# Patient Record
Sex: Female | Born: 1955
Health system: Southern US, Community
[De-identification: ages and names within clinical notes are randomized; demographics above are authoritative.]

## PROBLEM LIST (undated history)

## (undated) DIAGNOSIS — Z9889 Other specified postprocedural states: Secondary | ICD-10-CM

## (undated) DIAGNOSIS — J45909 Unspecified asthma, uncomplicated: Secondary | ICD-10-CM

## (undated) DIAGNOSIS — R51 Headache: Secondary | ICD-10-CM

## (undated) DIAGNOSIS — M797 Fibromyalgia: Secondary | ICD-10-CM

## (undated) DIAGNOSIS — Z9013 Acquired absence of bilateral breasts and nipples: Secondary | ICD-10-CM

## (undated) DIAGNOSIS — G4730 Sleep apnea, unspecified: Secondary | ICD-10-CM

## (undated) DIAGNOSIS — R112 Nausea with vomiting, unspecified: Secondary | ICD-10-CM

## (undated) DIAGNOSIS — R519 Headache, unspecified: Secondary | ICD-10-CM

## (undated) DIAGNOSIS — M1990 Unspecified osteoarthritis, unspecified site: Secondary | ICD-10-CM

## (undated) DIAGNOSIS — K219 Gastro-esophageal reflux disease without esophagitis: Secondary | ICD-10-CM

## (undated) DIAGNOSIS — K8010 Calculus of gallbladder with chronic cholecystitis without obstruction: Secondary | ICD-10-CM

## (undated) DIAGNOSIS — Z90710 Acquired absence of both cervix and uterus: Secondary | ICD-10-CM

## (undated) DIAGNOSIS — C801 Malignant (primary) neoplasm, unspecified: Secondary | ICD-10-CM

## (undated) HISTORY — DX: Gastro-esophageal reflux disease without esophagitis: K21.9

## (undated) HISTORY — PX: EYE SURGERY: SHX253

## (undated) HISTORY — PX: BREAST SURGERY: SHX581

## (undated) HISTORY — DX: Unspecified osteoarthritis, unspecified site: M19.90

## (undated) HISTORY — PX: ABDOMINAL HYSTERECTOMY: SHX81

## (undated) HISTORY — PX: BARIATRIC SURGERY: SHX1103

---

## 2013-01-02 ENCOUNTER — Emergency Department (HOSPITAL_COMMUNITY): Payer: No Typology Code available for payment source

## 2013-01-02 ENCOUNTER — Encounter (HOSPITAL_COMMUNITY): Payer: Self-pay | Admitting: Emergency Medicine

## 2013-01-02 ENCOUNTER — Emergency Department (HOSPITAL_COMMUNITY)
Admission: EM | Admit: 2013-01-02 | Discharge: 2013-01-02 | Disposition: A | Discharge status: Home / Self Care | Payer: No Typology Code available for payment source | Attending: Emergency Medicine | Admitting: Emergency Medicine

## 2013-01-02 DIAGNOSIS — Y92410 Unspecified street and highway as the place of occurrence of the external cause: Secondary | ICD-10-CM | POA: Insufficient documentation

## 2013-01-02 DIAGNOSIS — S0990XA Unspecified injury of head, initial encounter: Secondary | ICD-10-CM | POA: Insufficient documentation

## 2013-01-02 DIAGNOSIS — F411 Generalized anxiety disorder: Secondary | ICD-10-CM | POA: Insufficient documentation

## 2013-01-02 DIAGNOSIS — S0993XA Unspecified injury of face, initial encounter: Secondary | ICD-10-CM | POA: Insufficient documentation

## 2013-01-02 DIAGNOSIS — IMO0002 Reserved for concepts with insufficient information to code with codable children: Secondary | ICD-10-CM | POA: Insufficient documentation

## 2013-01-02 DIAGNOSIS — Z8589 Personal history of malignant neoplasm of other organs and systems: Secondary | ICD-10-CM | POA: Insufficient documentation

## 2013-01-02 DIAGNOSIS — Y9389 Activity, other specified: Secondary | ICD-10-CM | POA: Insufficient documentation

## 2013-01-02 HISTORY — DX: Other specified postprocedural states: Z98.890

## 2013-01-02 HISTORY — DX: Acquired absence of both cervix and uterus: Z90.710

## 2013-01-02 HISTORY — DX: Acquired absence of bilateral breasts and nipples: Z90.13

## 2013-01-02 HISTORY — DX: Malignant (primary) neoplasm, unspecified: C80.1

## 2013-01-02 LAB — BASIC METABOLIC PANEL
BUN: 16 mg/dL (ref 6–23)
Calcium: 9.4 mg/dL (ref 8.4–10.5)
Creatinine, Ser: 0.92 mg/dL (ref 0.50–1.10)
GFR calc Af Amer: 79 mL/min — ABNORMAL LOW (ref >90)
GFR calc non Af Amer: 68 mL/min — ABNORMAL LOW (ref >90)
Glucose, Bld: 102 mg/dL — ABNORMAL HIGH (ref 70–99)
Sodium: 142 mEq/L (ref 135–145)

## 2013-01-02 LAB — CBC
HCT: 40.4 % (ref 36.0–46.0)
Hemoglobin: 13.9 g/dL (ref 12.0–15.0)
MCH: 30.2 pg (ref 26.0–34.0)
MCHC: 34.4 g/dL (ref 30.0–36.0)
RDW: 14 % (ref 11.5–15.5)

## 2013-01-02 MED ORDER — LORAZEPAM 2 MG/ML IJ SOLN
1.0000 mg | Freq: Once | INTRAMUSCULAR | Status: AC
  Administered 2013-01-02: 1 mg via INTRAMUSCULAR
  Filled 2013-01-02: qty 1

## 2013-01-02 MED ORDER — KETOROLAC TROMETHAMINE 30 MG/ML IJ SOLN
30.0000 mg | Freq: Once | INTRAMUSCULAR | Status: AC
  Administered 2013-01-02: 30 mg via INTRAMUSCULAR
  Filled 2013-01-02: qty 1

## 2013-01-02 MED ORDER — TRAMADOL HCL 50 MG PO TABS: 50.0000 mg | ORAL_TABLET | Freq: Four times a day (QID) | ORAL | Status: AC | PRN
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
## 2013-01-02 NOTE — ED Notes (Signed)
Pt in radiology at this time. 
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
## 2014-01-17 ENCOUNTER — Encounter (HOSPITAL_COMMUNITY): Payer: Self-pay | Admitting: Emergency Medicine

## 2014-06-17 ENCOUNTER — Ambulatory Visit (INDEPENDENT_AMBULATORY_CARE_PROVIDER_SITE_OTHER): Payer: BLUE CROSS/BLUE SHIELD | Admitting: Family Medicine

## 2014-06-17 VITALS — BP 102/70 | HR 65 | Temp 98.0°F | Resp 20 | Ht 67.0 in | Wt 198.0 lb

## 2014-06-17 DIAGNOSIS — S8011XA Contusion of right lower leg, initial encounter: Secondary | ICD-10-CM
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
## 2014-06-17 NOTE — Patient Instructions (Signed)
I do not think that your bruise will put you at risk for any other complications at this time Let us know if you have any other concerns.   Dr. Maceo Pro at [facility] I believe has some interest in mold exposure

## 2014-08-08 ENCOUNTER — Other Ambulatory Visit: Payer: Self-pay | Admitting: *Deleted

## 2014-08-08 DIAGNOSIS — R202 Paresthesia of skin: Principal | ICD-10-CM

## 2014-08-08 DIAGNOSIS — R200 Anesthesia of skin: Secondary | ICD-10-CM

## 2014-08-16 ENCOUNTER — Encounter: Payer: Self-pay | Admitting: Neurology

## 2014-09-13 ENCOUNTER — Ambulatory Visit (INDEPENDENT_AMBULATORY_CARE_PROVIDER_SITE_OTHER): Payer: BLUE CROSS/BLUE SHIELD | Admitting: Neurology

## 2014-09-13 DIAGNOSIS — R202 Paresthesia of skin: Secondary | ICD-10-CM | POA: Diagnosis not present

## 2014-09-13 DIAGNOSIS — R200 Anesthesia of skin: Secondary | ICD-10-CM

## 2014-09-13 DIAGNOSIS — G5601 Carpal tunnel syndrome, right upper limb: Secondary | ICD-10-CM

## 2014-09-13 NOTE — Procedures (Signed)
[facility]  [place], [place]  [place], [place] [address] Tel: [phone] Fax:  [phone] Test Date:  09/13/2014  Patient: Anna Carroll DOB: [birth year] Physician: Narda Amber  Sex: Female Height: 5\' 7"  Ref Phys: Dr. Moreen Fowler  ID#: [id number] Temp: 33.0C Technician: Jerilynn Mages. Dean   Patient Complaints: This is a 59 year-old female presenting for evaluation of right hand weakness and paresthesias.   NCV & EMG Findings: Extensive electrodiagnostic testing of the right upper extremity shows:  1. Right median sensory response is absent. Right ulnar and radial sensory responses are within normal limits. 2. Right median motor nerve showed prolonged distal onset latency (7.9 ms) and reduced amplitude (4.7 mV). Right ulnar motor responses within normal limits. 3. Chronic motor axon loss changes isolated to the right abductor pollicis brevis muscle, without accompanied active denervation.  Impression: 1. Right median neuropathy, at or distal to the wrist, consistent with the clinical diagnosis of carpal tunnel syndrome. Overall, these findings are severe in degree electrically. 2. There is no evidence of a cervical radiculopathy affecting the right side.   ___________________________ Narda Amber, DO    Nerve Conduction Studies Anti Sensory Summary Table   Stim Site NR Peak (ms) Norm Peak (ms) P-T Amp (V) Norm P-T Amp  Right Median Anti Sensory (2nd Digit)  Wrist NR  <3.6  >15  Right Radial Anti Sensory (Base 1st Digit)  Wrist    1.7 <2.7 42.1 >14  Right Ulnar Anti Sensory (5th Digit)  Wrist    3.0 <3.1 46.4 >10   Motor Summary Table   Stim Site NR Onset (ms) Norm Onset (ms) O-P Amp (mV) Norm O-P Amp Site1 Site2 Delta-0 (ms) Dist (cm) Vel (m/s) Norm Vel (m/s)  Right Median Motor (Abd Poll Brev)  Wrist    7.9 <4.0 4.7 >6 Elbow Wrist 4.6 28.0 61 >50  Elbow    12.5  4.5         Right Ulnar Motor (Abd Dig Minimi)  Wrist    2.8 <3.1 12.3 >7 B Elbow Wrist 3.9  21.0 54 >50  B Elbow    6.7  12.7  A Elbow B Elbow 1.6 10.0 62 >50  A Elbow    8.3  12.7          EMG   Side Muscle Ins Act Fibs Psw Fasc Number Recrt Dur Dur. Amp Amp. Poly Poly. Comment  Right 1stDorInt Nml Nml Nml Nml Nml Nml Nml Nml Nml Nml Nml Nml N/A  Right Abd Poll Brev Nml Nml Nml Nml 1- Rapid Some 1+ Some 1+ Nml Nml N/A  Right Ext Indicis Nml Nml Nml Nml Nml Nml Nml Nml Nml Nml Nml Nml N/A  Right PronatorTeres Nml Nml Nml Nml Nml Nml Nml Nml Nml Nml Nml Nml N/A  Right Biceps Nml Nml Nml Nml Nml Nml Nml Nml Nml Nml Nml Nml N/A  Right Triceps Nml Nml Nml Nml Nml Nml Nml Nml Nml Nml Nml Nml N/A  Right Deltoid Nml Nml Nml Nml Nml Nml Nml Nml Nml Nml Nml Nml N/A      Waveforms:

## 2014-11-14 ENCOUNTER — Telehealth: Payer: Self-pay | Admitting: *Deleted

## 2014-11-14 NOTE — Telephone Encounter (Signed)
EMG SENT.

## 2014-11-14 NOTE — Telephone Encounter (Signed)
Patient requesting a copy of her EMG be sent to Dr. Otho Najjar @ [facility].

## 2014-12-28 ENCOUNTER — Other Ambulatory Visit: Payer: Self-pay | Admitting: Family Medicine

## 2014-12-28 DIAGNOSIS — R1010 Upper abdominal pain, unspecified: Secondary | ICD-10-CM

## 2014-12-30 ENCOUNTER — Ambulatory Visit
Admission: RE | Admit: 2014-12-30 | Discharge: 2014-12-30 | Disposition: A | Discharge status: Home / Self Care | Payer: BLUE CROSS/BLUE SHIELD | Source: Ambulatory Visit | Attending: Family Medicine | Admitting: Family Medicine

## 2014-12-30 DIAGNOSIS — R1010 Upper abdominal pain, unspecified: Secondary | ICD-10-CM

## 2015-01-04 ENCOUNTER — Ambulatory Visit: Payer: Self-pay | Admitting: General Surgery

## 2015-01-09 ENCOUNTER — Encounter (HOSPITAL_COMMUNITY): Payer: Self-pay | Admitting: *Deleted

## 2015-01-09 MED ORDER — CHLORHEXIDINE GLUCONATE 4 % EX LIQD: 1.0000 "application " | Freq: Once | CUTANEOUS | Status: DC

## 2015-01-09 MED ORDER — CEFAZOLIN SODIUM-DEXTROSE 2-3 GM-% IV SOLR
2.0000 g | INTRAVENOUS | Status: AC
  Administered 2015-01-10: 2 g via INTRAVENOUS
  Filled 2015-01-09: qty 50

## 2015-01-09 NOTE — Progress Notes (Signed)
Pt denies SOB, chest pain, and being under the care of a cardiologist. Pt denies having a cardiac cath but stated that several cardiac studies, an EKG and chest x ray were performed at [facility]; records requested. Pt made aware to stop  taking Aspirin,otc vitamins and herbal medications. Do not take any NSAIDs ie: Ibuprofen, Advil, Naproxen or any medication containing Aspirin. Pt verbalized understanding of all pre-op instructions.

## 2015-01-10 ENCOUNTER — Ambulatory Visit (HOSPITAL_COMMUNITY): Payer: BLUE CROSS/BLUE SHIELD

## 2015-01-10 ENCOUNTER — Ambulatory Visit (HOSPITAL_COMMUNITY): Payer: BLUE CROSS/BLUE SHIELD | Admitting: Certified Registered Nurse Anesthetist

## 2015-01-10 ENCOUNTER — Observation Stay (HOSPITAL_COMMUNITY)
Admission: RE | Admit: 2015-01-10 | Discharge: 2015-01-11 | Disposition: A | Discharge status: Home / Self Care | Payer: BLUE CROSS/BLUE SHIELD | Source: Ambulatory Visit | Attending: General Surgery | Admitting: General Surgery

## 2015-01-10 ENCOUNTER — Encounter (HOSPITAL_COMMUNITY): Payer: Self-pay | Admitting: *Deleted

## 2015-01-10 ENCOUNTER — Encounter (HOSPITAL_COMMUNITY)
Admission: RE | Disposition: A | Discharge status: Home / Self Care | Payer: Self-pay | Source: Ambulatory Visit | Attending: General Surgery

## 2015-01-10 DIAGNOSIS — Z9049 Acquired absence of other specified parts of digestive tract: Secondary | ICD-10-CM

## 2015-01-10 DIAGNOSIS — Z853 Personal history of malignant neoplasm of breast: Secondary | ICD-10-CM | POA: Diagnosis not present

## 2015-01-10 DIAGNOSIS — Z79899 Other long term (current) drug therapy: Secondary | ICD-10-CM | POA: Diagnosis not present

## 2015-01-10 DIAGNOSIS — K219 Gastro-esophageal reflux disease without esophagitis: Secondary | ICD-10-CM | POA: Insufficient documentation

## 2015-01-10 DIAGNOSIS — J45909 Unspecified asthma, uncomplicated: Secondary | ICD-10-CM | POA: Insufficient documentation

## 2015-01-10 DIAGNOSIS — K8010 Calculus of gallbladder with chronic cholecystitis without obstruction: Principal | ICD-10-CM | POA: Insufficient documentation

## 2015-01-10 DIAGNOSIS — Z419 Encounter for procedure for purposes other than remedying health state, unspecified: Secondary | ICD-10-CM

## 2015-01-10 HISTORY — PX: CHOLECYSTECTOMY, LAPAROSCOPIC: SHX56

## 2015-01-10 HISTORY — PX: CHOLECYSTECTOMY: SHX55

## 2015-01-10 HISTORY — DX: Headache: R51

## 2015-01-10 HISTORY — DX: Nausea with vomiting, unspecified: R11.2

## 2015-01-10 HISTORY — DX: Sleep apnea, unspecified: G47.30

## 2015-01-10 HISTORY — DX: Calculus of gallbladder with chronic cholecystitis without obstruction: K80.10

## 2015-01-10 HISTORY — DX: Headache, unspecified: R51.9

## 2015-01-10 HISTORY — DX: Unspecified asthma, uncomplicated: J45.909

## 2015-01-10 HISTORY — DX: Other specified postprocedural states: Z98.890

## 2015-01-10 HISTORY — DX: Fibromyalgia: M79.7

## 2015-01-10 LAB — COMPREHENSIVE METABOLIC PANEL
ALT: 18 U/L (ref 14–54)
ANION GAP: 7 (ref 5–15)
AST: 15 U/L (ref 15–41)
Albumin: 3.7 g/dL (ref 3.5–5.0)
Alkaline Phosphatase: 72 U/L (ref 38–126)
BUN: 10 mg/dL (ref 6–20)
CALCIUM: 9.4 mg/dL (ref 8.9–10.3)
CHLORIDE: 109 mmol/L (ref 101–111)
CO2: 27 mmol/L (ref 22–32)
Creatinine, Ser: 0.97 mg/dL (ref 0.44–1.00)
GFR calc non Af Amer: >60 mL/min (ref >60)
Glucose, Bld: 96 mg/dL (ref 65–99)
Potassium: 4.2 mmol/L (ref 3.5–5.1)
SODIUM: 143 mmol/L (ref 135–145)
Total Bilirubin: 0.7 mg/dL (ref 0.3–1.2)
Total Protein: 6.1 g/dL — ABNORMAL LOW (ref 6.5–8.1)

## 2015-01-10 LAB — CBC WITH DIFFERENTIAL/PLATELET
Basophils Absolute: 0 10*3/uL (ref 0.0–0.1)
Basophils Relative: 0 %
EOS ABS: 0.1 10*3/uL (ref 0.0–0.7)
EOS PCT: 1 %
HCT: 38 % (ref 36.0–46.0)
Hemoglobin: 12.4 g/dL (ref 12.0–15.0)
LYMPHS PCT: 44 %
Lymphs Abs: 2.3 10*3/uL (ref 0.7–4.0)
MCH: 29 pg (ref 26.0–34.0)
MCHC: 32.6 g/dL (ref 30.0–36.0)
MCV: 89 fL (ref 78.0–100.0)
MONO ABS: 0.5 10*3/uL (ref 0.1–1.0)
MONOS PCT: 9 %
Neutro Abs: 2.4 10*3/uL (ref 1.7–7.7)
Neutrophils Relative %: 46 %
PLATELETS: 176 10*3/uL (ref 150–400)
RBC: 4.27 MIL/uL (ref 3.87–5.11)
RDW: 13.7 % (ref 11.5–15.5)
WBC: 5.3 10*3/uL (ref 4.0–10.5)

## 2015-01-10 SURGERY — LAPAROSCOPIC CHOLECYSTECTOMY WITH INTRAOPERATIVE CHOLANGIOGRAM
Anesthesia: General | Site: Abdomen

## 2015-01-10 MED ORDER — ONDANSETRON HCL 4 MG/2ML IJ SOLN: 4.0000 mg | Freq: Four times a day (QID) | INTRAMUSCULAR | Status: DC | PRN

## 2015-01-10 MED ORDER — LIDOCAINE HCL (CARDIAC) 20 MG/ML IV SOLN
INTRAVENOUS | Status: AC
  Filled 2015-01-10: qty 5

## 2015-01-10 MED ORDER — ONDANSETRON 4 MG PO TBDP
4.0000 mg | ORAL_TABLET | Freq: Four times a day (QID) | ORAL | Status: DC | PRN
  Filled 2015-01-10: qty 1

## 2015-01-10 MED ORDER — GLYCOPYRROLATE 0.2 MG/ML IJ SOLN
INTRAMUSCULAR | Status: AC
  Filled 2015-01-10: qty 3

## 2015-01-10 MED ORDER — MIDAZOLAM HCL 2 MG/2ML IJ SOLN
INTRAMUSCULAR | Status: AC
  Filled 2015-01-10: qty 4

## 2015-01-10 MED ORDER — PROPOFOL 10 MG/ML IV BOLUS
INTRAVENOUS | Status: AC
  Filled 2015-01-10: qty 20

## 2015-01-10 MED ORDER — FENTANYL CITRATE (PF) 250 MCG/5ML IJ SOLN
INTRAMUSCULAR | Status: AC
  Filled 2015-01-10: qty 5

## 2015-01-10 MED ORDER — LACTATED RINGERS IV SOLN
INTRAVENOUS | Status: DC
Start: 2015-01-10 — End: 2015-01-11
  Administered 2015-01-10 (×2): via INTRAVENOUS

## 2015-01-10 MED ORDER — 0.9 % SODIUM CHLORIDE (POUR BTL) OPTIME
TOPICAL | Status: DC | PRN
  Administered 2015-01-10: 1000 mL

## 2015-01-10 MED ORDER — ONDANSETRON HCL 4 MG/2ML IJ SOLN
INTRAMUSCULAR | Status: AC
  Filled 2015-01-10: qty 2

## 2015-01-10 MED ORDER — KCL IN DEXTROSE-NACL 20-5-0.45 MEQ/L-%-% IV SOLN
INTRAVENOUS | Status: DC
  Administered 2015-01-10: 17:00:00 via INTRAVENOUS
  Filled 2015-01-10: qty 1000

## 2015-01-10 MED ORDER — LIDOCAINE HCL (CARDIAC) 20 MG/ML IV SOLN
INTRAVENOUS | Status: DC | PRN
  Administered 2015-01-10: 50 mg via INTRAVENOUS

## 2015-01-10 MED ORDER — ROCURONIUM BROMIDE 50 MG/5ML IV SOLN
INTRAVENOUS | Status: AC
  Filled 2015-01-10: qty 1

## 2015-01-10 MED ORDER — PROMETHAZINE HCL 25 MG/ML IJ SOLN
6.2500 mg | INTRAMUSCULAR | Status: DC | PRN
Start: 2015-01-10 — End: 2015-01-10
  Administered 2015-01-10: 6.25 mg via INTRAVENOUS

## 2015-01-10 MED ORDER — ENOXAPARIN SODIUM 40 MG/0.4ML ~~LOC~~ SOLN
40.0000 mg | SUBCUTANEOUS | Status: DC
  Administered 2015-01-11: 40 mg via SUBCUTANEOUS
  Filled 2015-01-10: qty 0.4

## 2015-01-10 MED ORDER — OXYCODONE HCL 5 MG PO TABS: 5.0000 mg | ORAL_TABLET | ORAL | Status: AC | PRN

## 2015-01-10 MED ORDER — PROPOFOL 10 MG/ML IV BOLUS
INTRAVENOUS | Status: DC | PRN
Start: 2015-01-10 — End: 2015-01-10
  Administered 2015-01-10: 140 mg via INTRAVENOUS

## 2015-01-10 MED ORDER — OXYCODONE HCL 5 MG PO TABS
5.0000 mg | ORAL_TABLET | ORAL | Status: DC | PRN
  Administered 2015-01-10 – 2015-01-11 (×4): 10 mg via ORAL
  Filled 2015-01-10 (×3): qty 2

## 2015-01-10 MED ORDER — ONDANSETRON HCL 4 MG/2ML IJ SOLN
INTRAMUSCULAR | Status: DC | PRN
  Administered 2015-01-10: 4 mg via INTRAVENOUS

## 2015-01-10 MED ORDER — TRAMADOL HCL 50 MG PO TABS
50.0000 mg | ORAL_TABLET | Freq: Four times a day (QID) | ORAL | Status: DC | PRN
  Filled 2015-01-10: qty 1

## 2015-01-10 MED ORDER — PHENYLEPHRINE HCL 10 MG/ML IJ SOLN
INTRAMUSCULAR | Status: AC
  Filled 2015-01-10: qty 1

## 2015-01-10 MED ORDER — MORPHINE SULFATE (PF) 2 MG/ML IV SOLN: 2.0000 mg | INTRAVENOUS | Status: DC | PRN

## 2015-01-10 MED ORDER — NEOSTIGMINE METHYLSULFATE 10 MG/10ML IV SOLN
INTRAVENOUS | Status: AC
  Filled 2015-01-10: qty 1

## 2015-01-10 MED ORDER — OXYCODONE HCL 5 MG PO TABS
ORAL_TABLET | ORAL | Status: AC
  Administered 2015-01-10: 10 mg via ORAL
  Filled 2015-01-10: qty 2

## 2015-01-10 MED ORDER — PANTOPRAZOLE SODIUM 40 MG PO TBEC
40.0000 mg | DELAYED_RELEASE_TABLET | Freq: Two times a day (BID) | ORAL | Status: DC
  Administered 2015-01-11: 40 mg via ORAL
  Filled 2015-01-10 (×2): qty 1

## 2015-01-10 MED ORDER — ROCURONIUM BROMIDE 100 MG/10ML IV SOLN
INTRAVENOUS | Status: DC | PRN
  Administered 2015-01-10: 30 mg via INTRAVENOUS

## 2015-01-10 MED ORDER — SODIUM CHLORIDE 0.9 % IR SOLN
Status: DC | PRN
  Administered 2015-01-10: 1000 mL

## 2015-01-10 MED ORDER — SUCCINYLCHOLINE CHLORIDE 20 MG/ML IJ SOLN
INTRAMUSCULAR | Status: DC | PRN
  Administered 2015-01-10: 100 mg via INTRAVENOUS

## 2015-01-10 MED ORDER — BUPIVACAINE-EPINEPHRINE (PF) 0.25% -1:200000 IJ SOLN
INTRAMUSCULAR | Status: AC
  Filled 2015-01-10: qty 30

## 2015-01-10 MED ORDER — MEPERIDINE HCL 25 MG/ML IJ SOLN
INTRAMUSCULAR | Status: AC
  Administered 2015-01-10: 6.25 mg via INTRAVENOUS
  Filled 2015-01-10: qty 1

## 2015-01-10 MED ORDER — GLYCOPYRROLATE 0.2 MG/ML IJ SOLN
INTRAMUSCULAR | Status: DC | PRN
  Administered 2015-01-10: 0.6 mg via INTRAVENOUS

## 2015-01-10 MED ORDER — NEOSTIGMINE METHYLSULFATE 10 MG/10ML IV SOLN
INTRAVENOUS | Status: DC | PRN
  Administered 2015-01-10: 5 mg via INTRAVENOUS

## 2015-01-10 MED ORDER — MEPERIDINE HCL 25 MG/ML IJ SOLN
6.2500 mg | INTRAMUSCULAR | Status: DC | PRN
  Administered 2015-01-10 (×2): 6.25 mg via INTRAVENOUS

## 2015-01-10 MED ORDER — HYDROMORPHONE HCL 1 MG/ML IJ SOLN
0.2500 mg | INTRAMUSCULAR | Status: DC | PRN
  Administered 2015-01-10 (×2): 0.25 mg via INTRAVENOUS

## 2015-01-10 MED ORDER — MIDAZOLAM HCL 5 MG/5ML IJ SOLN
INTRAMUSCULAR | Status: DC | PRN
  Administered 2015-01-10: 2 mg via INTRAVENOUS

## 2015-01-10 MED ORDER — SUCCINYLCHOLINE CHLORIDE 20 MG/ML IJ SOLN
INTRAMUSCULAR | Status: AC
  Filled 2015-01-10: qty 1

## 2015-01-10 MED ORDER — PROMETHAZINE HCL 25 MG/ML IJ SOLN
INTRAMUSCULAR | Status: AC
  Administered 2015-01-10: 6.25 mg via INTRAVENOUS
  Filled 2015-01-10: qty 1

## 2015-01-10 MED ORDER — FENTANYL CITRATE (PF) 100 MCG/2ML IJ SOLN
INTRAMUSCULAR | Status: DC | PRN
  Administered 2015-01-10: 100 ug via INTRAVENOUS
  Administered 2015-01-10 (×2): 50 ug via INTRAVENOUS

## 2015-01-10 MED ORDER — BUPIVACAINE-EPINEPHRINE 0.25% -1:200000 IJ SOLN
INTRAMUSCULAR | Status: DC | PRN
  Administered 2015-01-10: 9 mL

## 2015-01-10 MED ORDER — SODIUM CHLORIDE 0.9 % IV SOLN
INTRAVENOUS | Status: DC | PRN
  Administered 2015-01-10: 14 mL

## 2015-01-10 MED ORDER — HYDROMORPHONE HCL 1 MG/ML IJ SOLN
INTRAMUSCULAR | Status: AC
  Administered 2015-01-10: 0.25 mg via INTRAVENOUS
  Filled 2015-01-10: qty 1

## 2015-01-10 SURGICAL SUPPLY — 45 items
APPLIER CLIP 5 13 M/L LIGAMAX5 (MISCELLANEOUS) ×2
BLADE SURG ROTATE 9660 (MISCELLANEOUS) IMPLANT
CANISTER SUCTION 2500CC (MISCELLANEOUS) ×2 IMPLANT
CHLORAPREP W/TINT 26ML (MISCELLANEOUS) ×2 IMPLANT
CLIP APPLIE 5 13 M/L LIGAMAX5 (MISCELLANEOUS) ×1 IMPLANT
COVER MAYO STAND STRL (DRAPES) ×2 IMPLANT
COVER SURGICAL LIGHT HANDLE (MISCELLANEOUS) ×2 IMPLANT
DRAPE C-ARM 42X72 X-RAY (DRAPES) ×2 IMPLANT
ELECT REM PT RETURN 9FT ADLT (ELECTROSURGICAL) ×2
ELECTRODE REM PT RTRN 9FT ADLT (ELECTROSURGICAL) ×1 IMPLANT
FILTER SMOKE EVAC LAPAROSHD (FILTER) IMPLANT
GLOVE BIO SURGEON STRL SZ8 (GLOVE) ×2 IMPLANT
GLOVE BIOGEL PI IND STRL 7.0 (GLOVE) ×2 IMPLANT
GLOVE BIOGEL PI IND STRL 8 (GLOVE) ×2 IMPLANT
GLOVE BIOGEL PI INDICATOR 7.0 (GLOVE) ×2
GLOVE BIOGEL PI INDICATOR 8 (GLOVE) ×2
GLOVE SURG SS PI 6.5 STRL IVOR (GLOVE) ×4 IMPLANT
GLOVE SURG SS PI 8.0 STRL IVOR (GLOVE) ×2 IMPLANT
GOWN STRL REUS W/ TWL LRG LVL3 (GOWN DISPOSABLE) ×1 IMPLANT
GOWN STRL REUS W/ TWL XL LVL3 (GOWN DISPOSABLE) ×2 IMPLANT
GOWN STRL REUS W/TWL LRG LVL3 (GOWN DISPOSABLE) ×1
GOWN STRL REUS W/TWL XL LVL3 (GOWN DISPOSABLE) ×2
KIT BASIN OR (CUSTOM PROCEDURE TRAY) ×2 IMPLANT
KIT ROOM TURNOVER OR (KITS) ×2 IMPLANT
L-HOOK LAP DISP 36CM (ELECTROSURGICAL) ×2
LHOOK LAP DISP 36CM (ELECTROSURGICAL) ×1 IMPLANT
LIQUID BAND (GAUZE/BANDAGES/DRESSINGS) ×2 IMPLANT
NEEDLE 22X1 1/2 (OR ONLY) (NEEDLE) ×2 IMPLANT
NS IRRIG 1000ML POUR BTL (IV SOLUTION) ×2 IMPLANT
PAD ARMBOARD 7.5X6 YLW CONV (MISCELLANEOUS) ×2 IMPLANT
PENCIL BUTTON HOLSTER BLD 10FT (ELECTRODE) ×2 IMPLANT
POUCH RETRIEVAL ECOSAC 10 (ENDOMECHANICALS) ×1 IMPLANT
POUCH RETRIEVAL ECOSAC 10MM (ENDOMECHANICALS) ×1
SCISSORS LAP 5X35 DISP (ENDOMECHANICALS) ×2 IMPLANT
SET CHOLANGIOGRAPH 5 50 .035 (SET/KITS/TRAYS/PACK) ×2 IMPLANT
SET IRRIG TUBING LAPAROSCOPIC (IRRIGATION / IRRIGATOR) ×2 IMPLANT
SLEEVE ENDOPATH XCEL 5M (ENDOMECHANICALS) ×4 IMPLANT
SPECIMEN JAR SMALL (MISCELLANEOUS) ×2 IMPLANT
SUT VIC AB 4-0 PS2 27 (SUTURE) ×2 IMPLANT
TOWEL OR 17X24 6PK STRL BLUE (TOWEL DISPOSABLE) ×2 IMPLANT
TOWEL OR 17X26 10 PK STRL BLUE (TOWEL DISPOSABLE) ×2 IMPLANT
TRAY LAPAROSCOPIC MC (CUSTOM PROCEDURE TRAY) ×2 IMPLANT
TROCAR XCEL BLUNT TIP 100MML (ENDOMECHANICALS) ×2 IMPLANT
TROCAR XCEL NON-BLD 5MMX100MML (ENDOMECHANICALS) ×2 IMPLANT
TUBING INSUFFLATION (TUBING) ×2 IMPLANT

## 2015-01-10 NOTE — Anesthesia Procedure Notes (Signed)
Procedure Name: Intubation Date/Time: 01/10/2015 12:52 PM Performed by: Vennie Homans Pre-anesthesia Checklist: Patient identified, Timeout performed, Emergency Drugs available, Suction available and Patient being monitored Patient Re-evaluated:Patient Re-evaluated prior to inductionOxygen Delivery Method: Circle system utilized Preoxygenation: Pre-oxygenation with 100% oxygen Intubation Type: IV induction Ventilation: Mask ventilation without difficulty Laryngoscope Size: Mac and 3 Grade View: Grade I Number of attempts: 1 Airway Equipment and Method: Stylet Placement Confirmation: ETT inserted through vocal cords under direct vision,  positive ETCO2 and breath sounds checked- equal and bilateral Secured at: 21 cm Tube secured with: Tape Dental Injury: Teeth and Oropharynx as per pre-operative assessment

## 2015-01-10 NOTE — Anesthesia Postprocedure Evaluation (Signed)
Anesthesia Post Note  Patient: Anna Carroll  Procedure(s) Performed: Procedure(s): LAPAROSCOPIC CHOLECYSTECTOMY WITH INTRAOPERATIVE CHOLANGIOGRAM  Anesthesia type: General  Patient location: PACU  Post pain: Pain level controlled  Post assessment: Post-op Vital signs reviewed  Last Vitals: BP 112/63 mmHg  Pulse 51  Temp(Src) 36.8 C (Oral)  Resp 17  Ht 5\' 7"  (1.702 m)  Wt 175 lb (79.379 kg)  BMI 27.40 kg/m2  SpO2 100%  Post vital signs: Reviewed  Level of consciousness: sedated  Complications: No apparent anesthesia complications

## 2015-01-10 NOTE — Op Note (Signed)
01/10/2015  1:45 PM  PATIENT:  Anna Carroll  59 y.o. female  PRE-OPERATIVE DIAGNOSIS:  chronic cholecystitis with cholelithiasis  POST-OPERATIVE DIAGNOSIS:  chronic cholecystitis with cholelithiasis  PROCEDURE:  Procedure(s): LAPAROSCOPIC CHOLECYSTECTOMY WITH INTRAOPERATIVE CHOLANGIOGRAM  SURGEON:  Surgeon(s): Georganna Skeans, MD  ASSISTANTS: none   ANESTHESIA:   local and general  EBL:  Total I/O In: 1000 [I.V.:1000] Out: -   BLOOD ADMINISTERED:none  DRAINS: none   SPECIMEN:  Excision  DISPOSITION OF SPECIMEN:  PATHOLOGY  COUNTS:  YES  DICTATION: .Dragon Dictation Findings: Evidence of chronic cholecystitis, negative intraoperative cholangiogram  Procedure in detail: [name] for laparoscopic cholecystectomy with cholangiogram. She was identified in the preop holding area. Informed consent was obtained. She received intravenous antibiotics. She was brought to the operating room and general endotracheal anesthesia was administered by the anesthesia staff. Time out procedure was performed.The infraumbilical region was infiltrated with local along a previous scar. Infraumbilical incision was made. Subcutaneous tissues were dissected down revealing the anterior fascia. This was divided sharply along the midline. Peritoneal cavity was entered carefully under direct vision without complication. A 0 Vicryl pursestring was placed around the fascial opening. Hassan trocar was inserted into the abdomen. The abdomen was insufflated with carbon dioxide in standard fashion. Under direct vision a 5 mm epigastric and 5 mm lateral ports were placed. Local was used at each port site. Laparoscopic exploration revealed mild Rochele Raring but no other significant adhesions. The dome the gallbladder was retracted superior medially. The infundibulum was retracted inferolaterally. Dissection began laterally and progressed medially easily identifying the cystic duct. Dissection  continued until a clear window was made with critical view between the cystic duct, the common bile duct, and the gallbladder. A clip was placed on the infundibular cystic duct junction. A small nick was made in the cystic duct and intraoperative cholangiogram was obtained. This demonstrated no common bile duct filling defects. Contrast flowed easily into the duodenum. Cholangiogram catheter was removed and the cystic duct was clipped 3 times proximally. It was then divided. Further dissection revealed the cystic artery which was clipped twice proximally and divided distally with cautery. Gallbladder was taken off the liver bed using cautery achieving excellent hemostasis along the way. Gallbladder was placed in a bag and removed from the abdomen via the infraumbilical port site. Liver bed was copiously irrigated hemostasis was ensured. Irrigation returned clear. Clips remained in excellent position. Ports were removed under direct vision. Pneumoperitoneum was released. Informed local fascia was closed by tying the pursestring. All 4 wounds were closed with running 4-0 Vicryl subcuticular followed by Dermabond. All counts were correct. Patient tolerated procedure well without apparent complications taken recovery in stable condition. PATIENT DISPOSITION:  PACU - hemodynamically stable.   Delay start of Pharmacological VTE agent (>24hrs) due to surgical blood loss or risk of bleeding:  no  Georganna Skeans, MD, MPH, FACS Pager: [phone]  10/25/20161:45 PM

## 2015-01-10 NOTE — Interval H&P Note (Signed)
History and Physical Interval Note:  01/10/2015 11:19 AM  Anna Carroll  has presented today for surgery, with the diagnosis of chronic cholecystitis with cholelithiasis  The various methods of treatment have been discussed with the patient and family. After consideration of risks, benefits and other options for treatment, the patient has consented to  Procedure(s): LAPAROSCOPIC CHOLECYSTECTOMY WITH INTRAOPERATIVE CHOLANGIOGRAM (N/A) as a surgical intervention .  She is aware of the increased risk of need to convert to open procedure due to previous abdominal surgeries.The patient's history has been reviewed, patient re-examined, no change in status, stable for surgery.  I have reviewed the patient's chart and labs.  Questions were answered to the patient's satisfaction.     [name]

## 2015-01-10 NOTE — H&P (Signed)
History of Present Illness Anna Neri E. Grandville Silos MD; 01/04/2015 11:55 AM) Patient words: gallbladder.  The patient is a 59 year old female who presents for evaluation of gallbladder disease. Earlene is referred by Dr. Moreen Fowler for consultation regarding possible cholecystectomy. She has had pain in her right upper quadrant intermittently since January. He usually would last only a couple hours and would come once a week or so. She has associated nausea and vomiting. This went on on an intermittent basis until last week when she had a longer attack. She was symptomatically like 36 hours and the pain went across her epigastrium and a core around to her back. She underwent abdominal ultrasound on October 14 demonstrating multiple gallstones and gallbladder wall measuring 5 mm. She has felt better since that attack.   Other Problems Marjean Donna, CMA; 01/04/2015 11:36 AM) Anxiety Disorder Arthritis Asthma Back Pain Bladder Problems Breast Cancer Cancer Chest pain Cholelithiasis Gastroesophageal Reflux Disease General anesthesia - complications Lump In Breast Migraine Headache  Past Surgical History Marjean Donna, CMA; 01/04/2015 11:36 AM) Breast Biopsy Bilateral. multiple Breast Reconstruction Bilateral. Hysterectomy (due to cancer) - Complete Knee Surgery Right. Mastectomy Bilateral. Sleeve Gastrectomy  Diagnostic Studies History Marjean Donna, CMA; 01/04/2015 11:36 AM) Colonoscopy 1-5 years ago Mammogram 1-3 years ago Pap Smear 1-5 years ago  Allergies Davy Pique Bynum, CMA; 01/04/2015 11:39 AM) No Known Drug Allergies10/19/2016  Medication History (Sonya Bynum, CMA; 01/04/2015 11:40 AM) Ondansetron HCl (4MG  Tablet, Oral) Active. Pantoprazole Sodium (40MG  Tablet DR, Oral) Active. Multivitamins (Oral) Active. TraMADol HCl (50MG  Tablet, Oral as needed) Active. Cyanocobalamin (1000MCG/ML Solution, Injection) Active. Vitamin D (Cholecalciferol)  (1000UNIT Capsule, Oral) Active.  Social History Marjean Donna, CMA; 01/04/2015 11:36 AM) Alcohol use Occasional alcohol use. Caffeine use Tea. No drug use Tobacco use Never smoker.  Family History Marjean Donna, CMA; 01/04/2015 11:36 AM) Bleeding disorder Mother, Sister. Cancer Sister. Cerebrovascular Accident Family Members In General. Hypertension Father. Malignant Neoplasm Of Pancreas Father. Migraine Headache Sister. Ovarian Cancer Sister. Thyroid problems Family Members In General, Sister.  Pregnancy / Birth History Marjean Donna, [place]; 01/04/2015 11:36 AM) Age at menarche 60 years. Age of menopause <45 Gravida 2 Irregular periods Maternal age 3-20 Para 0    Review of Systems ([place]; 01/04/2015 11:36 AM) General Present- Appetite Loss, Fatigue and Night Sweats. Not Present- Chills, Fever, Weight Gain and Weight Loss. Skin Present- Dryness and Rash. Not Present- Change in Wart/Mole, Hives, Jaundice, New Lesions, Non-Healing Wounds and Ulcer. HEENT Present- Visual Disturbances, Wears glasses/contact lenses and Yellow Eyes. Not Present- Earache, Hearing Loss, Hoarseness, Nose Bleed, Oral Ulcers, Ringing in the Ears, Seasonal Allergies, Sinus Pain and Sore Throat. Respiratory Present- Chronic Cough. Not Present- Bloody sputum, Difficulty Breathing, Snoring and Wheezing. Cardiovascular Present- Chest Pain, Leg Cramps and Rapid Heart Rate. Not Present- Difficulty Breathing Lying Down, Palpitations, Shortness of Breath and Swelling of Extremities. Gastrointestinal Present- Abdominal Pain, Change in Bowel Habits, Constipation, Excessive gas, Gets full quickly at meals, Indigestion, Nausea and Vomiting. Not Present- Bloating, Bloody Stool, Chronic diarrhea, Difficulty Swallowing, Hemorrhoids and Rectal Pain. Female Genitourinary Present- Frequency and Urgency. Not Present- Nocturia, Painful Urination and Pelvic Pain. Neurological Present- Decreased Memory,  Headaches, Numbness, Tingling and Weakness. Not Present- Fainting, Seizures, Tremor and Trouble walking. Psychiatric Present- Anxiety and Change in Sleep Pattern. Not Present- Bipolar, Depression, Fearful and Frequent crying. Endocrine Present- Cold Intolerance, Excessive Hunger and Hair Changes. Not Present- Heat Intolerance, Hot flashes and New Diabetes. Hematology Present- Easy Bruising. Not Present- Excessive bleeding, Gland problems, HIV  and Persistent Infections.  Vitals (Sonya Bynum CMA; 01/04/2015 11:38 AM) 01/04/2015 11:37 AM Weight: 177 lb Height: 67in Body Surface Area: 1.92 m Body Mass Index: 27.72 kg/m  Temp.: 17F(Temporal)  Pulse: 81 (Regular)  BP: 120/80 (Sitting, Left Arm, Standard)       Physical Exam Anna Neri E. Grandville Silos MD; 01/04/2015 11:57 AM) General Note: No distress   Head and Neck Note: Normocephalic, neck is supple, no palpable lymphadenopathy   ENMT Note: Eyes have contact lenses, oral mucosa is moist   Chest and Lung Exam Note: Clear to auscultation bilaterally, no rales, no wheezing   Cardiovascular Note: Regular rate and rhythm   Abdomen Note: Soft, no significant right upper quadrant tenderness, multiple surgical scars, previous TRAM reconstruction, no masses   Neurologic Note: Awake and alert, oriented, normal gait and station   Musculoskeletal Note: No deformity or tenderness     Assessment & Plan Anna Neri E. Grandville Silos MD; 01/04/2015 11:58 AM) CHRONIC CHOLECYSTITIS WITH CALCULUS (K80.10) Impression: I offered laparoscopic cholecystectomy, possible open with intraoperative cholangiogram. Procedure, risks, and benefits were discussed in detail with her. In light of her abdominal surgeries in the past including sleeve gastrectomy and TRAM flap reconstruction, she is at higher risk for conversion to open procedure. She understands this and we discussed things in detail. She would like to be scheduled soon and I agree  in light of her symptoms. We look forward to scheduling within the next week. She is agreeable. Current Plans Pt Education - [employer] Free Text Education/Instructions: discussed with patient and provided information.  Georganna Skeans, MD, MPH, FACS Trauma: [phone] General Surgery: [phone]

## 2015-01-10 NOTE — Transfer of Care (Signed)
Immediate Anesthesia Transfer of Care Note  Patient: Anna Carroll  Procedure(s) Performed: Procedure(s): LAPAROSCOPIC CHOLECYSTECTOMY WITH INTRAOPERATIVE CHOLANGIOGRAM  Patient Location: PACU  Anesthesia Type:General  Level of Consciousness: sedated, unresponsive, lethargic and responds to stimulation  Airway & Oxygen Therapy: Patient Spontanous Breathing and Patient connected to nasal cannula oxygen  Post-op Assessment: Report given to RN, Post -op Vital signs reviewed and stable, Patient moving all extremities X 4 and Patient able to stick tongue midline  Post vital signs: Reviewed and stable  Last Vitals:  Filed Vitals:   01/10/15 1355  BP:   Pulse:   Temp: 36.7 C  Resp:     Complications: No apparent anesthesia complications

## 2015-01-10 NOTE — Anesthesia Preprocedure Evaluation (Signed)
Anesthesia Evaluation  Patient identified by MRN, date of birth, ID band Patient awake    Reviewed: Allergy & Precautions, NPO status , Patient's Chart, lab work & pertinent test results  History of Anesthesia Complications (+) PONV and history of anesthetic complications  Airway Mallampati: II  TM Distance: >3 FB Neck ROM: Full    Dental no notable dental hx.    Pulmonary neg pulmonary ROS, asthma , sleep apnea ,    Pulmonary exam normal breath sounds clear to auscultation       Cardiovascular negative cardio ROS Normal cardiovascular exam Rhythm:Regular Rate:Normal     Neuro/Psych  Headaches, negative psych ROS   GI/Hepatic Neg liver ROS, GERD  ,  Endo/Other  negative endocrine ROS  Renal/GU negative Renal ROS     Musculoskeletal  (+) Arthritis , Fibromyalgia -  Abdominal   Peds  Hematology negative hematology ROS (+)   Anesthesia Other Findings   Reproductive/Obstetrics negative OB ROS                             Anesthesia Physical Anesthesia Plan  ASA: II  Anesthesia Plan: General   Post-op Pain Management:    Induction: Intravenous  Airway Management Planned: Oral ETT  Additional Equipment:   Intra-op Plan:   Post-operative Plan: Extubation in OR  Informed Consent: I have reviewed the patients History and Physical, chart, labs and discussed the procedure including the risks, benefits and alternatives for the proposed anesthesia with the patient or authorized representative who has indicated his/her understanding and acceptance.   Dental advisory given  Plan Discussed with: CRNA  Anesthesia Plan Comments:         Anesthesia Quick Evaluation

## 2015-01-11 ENCOUNTER — Encounter (HOSPITAL_COMMUNITY): Payer: Self-pay | Admitting: General Surgery

## 2015-01-11 DIAGNOSIS — K8010 Calculus of gallbladder with chronic cholecystitis without obstruction: Secondary | ICD-10-CM | POA: Diagnosis not present

## 2015-01-11 MED ORDER — ACETAMINOPHEN 325 MG PO TABS
650.0000 mg | ORAL_TABLET | Freq: Once | ORAL | Status: DC
  Filled 2015-01-11: qty 2

## 2015-01-11 NOTE — Discharge Summary (Signed)
Physician Discharge Summary  Patient ID: Anna Carroll MRN: [id number] DOB/AGE: 1955-08-22 59 y.o.  Admit date: 01/10/2015 Discharge date: 01/11/2015  Admission Diagnoses:chronic cholecystitis with cholelithiasis  Discharge Diagnoses: S/P laparoscopic cholecystectomy with cholangiogram Active Problems:   S/P laparoscopic cholecystectomy   Discharged Condition: stable  Hospital Course: Little underwent an uneventful lap chole with IOC (neg). She did well post-op and is ready for D/C this AM.  Consults: None  Significant Diagnostic Studies: IOC  Treatments: surgery: above  Discharge Exam: Blood pressure 99/36, pulse 71, temperature 100 F (37.8 C), temperature source Oral, resp. rate 16, height 5\' 7"  (1.702 m), weight 79.379 kg (175 lb), SpO2 98 %. General appearance: alert and cooperative Resp: clear to auscultation bilaterally Cardio: regular rate and rhythm GI: soft, incisions CDI  Disposition: 01-Home or Self Care  Discharge Instructions    Diet - low sodium heart healthy    Complete by:  As directed      Diet - low sodium heart healthy    Complete by:  As directed      Discharge instructions    Complete by:  As directed   [facility] LAPAROSCOPIC SURGERY: POST OP INSTRUCTIONS Always review your discharge instruction sheet given to you by the facility where your surgery was performed. IF YOU HAVE DISABILITY OR FAMILY LEAVE FORMS, YOU MUST BRING THEM TO THE OFFICE FOR PROCESSING.   DO NOT GIVE THEM TO YOUR DOCTOR.  A prescription for pain medication may be given to you upon discharge.  Take your pain medication as prescribed, if needed.  If narcotic pain medicine is not needed, then you may take acetaminophen (Tylenol) or ibuprofen (Advil) as needed. Take your usually prescribed medications unless otherwise directed. If you need a refill on your pain medication, please contact your pharmacy.  They will contact our office to request  authorization. Prescriptions will not be filled after 5pm or on week-ends. You should follow a light diet the first few days after arrival home, such as soup and crackers, etc.  Be sure to include lots of fluids daily. Most patients will experience some swelling and bruising in the area of the incisions.  Ice packs will help.  Swelling and bruising can take several days to resolve.  It is common to experience some constipation if taking pain medication after surgery.  Increasing fluid intake and taking a stool softener (such as Colace) will usually help or prevent this problem from occurring.  A mild laxative (Milk of Magnesia or Miralax) should be taken according to package instructions if there are no bowel movements after 48 hours. Unless discharge instructions indicate otherwise, you may remove your bandages 24-48 hours after surgery, and you may shower at that time.  You may have steri-strips (small skin tapes) in place directly over the incision.  These strips should be left on the skin for 7-10 days.  If your surgeon used skin glue on the incision, you may shower in 24 hours.  The glue will flake off over the next 2-3 weeks.  Any sutures or staples will be removed at the office during your follow-up visit. ACTIVITIES:  You may resume regular (light) daily activities beginning the next day-such as daily self-care, walking, climbing stairs-gradually increasing activities as tolerated.  You may have sexual intercourse when it is comfortable.  Refrain from any heavy lifting or straining until approved by your doctor. You may drive when you are no longer taking prescription pain medication, you can comfortably wear a seatbelt,  and you can safely maneuver your car and apply brakes. RETURN TO WORK:  __________________________________________________________ Dennis Bast should see your doctor in the office for a follow-up appointment approximately 2-3 weeks after your surgery.  Make sure that you call for this  appointment within a day or two after you arrive home to insure a convenient appointment time. OTHER INSTRUCTIONS: __________________________________________________________________________________________________________________________ __________________________________________________________________________________________________________________________ WHEN TO CALL YOUR DOCTOR: Fever over 101.0 Inability to urinate Continued bleeding from incision. Increased pain, redness, or drainage from the incision. Increasing abdominal pain  The clinic staff is available to answer your questions during regular business hours.  Please don't hesitate to call and ask to speak to one of the nurses for clinical concerns.  If you have a medical emergency, go to the nearest emergency room or call 911.  A surgeon from [facility] is always on call at the hospital. [address], [place], [place], [place]  [address] ? [address], [place], [place]   [address] [phone] ? [phone] ? FAX [phone] Web site: [email or web address]     Increase activity slowly    Complete by:  As directed      Lifting restrictions    Complete by:  As directed   Do not lift over 10lbs for 4 weeks     Lifting restrictions    Complete by:  As directed   No lifting over 10lbs for 4 weeks.     No dressing needed    Complete by:  As directed      No dressing needed    Complete by:  As directed             Medication List    TAKE these medications        CLEAR EYES OP  Place 2 drops into both eyes every morning.     cyanocobalamin 1000 MCG/ML injection  Commonly known as:  (VITAMIN B-12)  Inject 1,000 mcg into the muscle every 30 (thirty) days.     oxyCODONE 5 MG immediate release tablet  Commonly known as:  Oxy IR/ROXICODONE  Take 1-2 tablets (5-10 mg total) by mouth every 4 (four) hours as needed for moderate pain, severe pain or breakthrough pain.     pantoprazole 40 MG  tablet  Commonly known as:  PROTONIX  Take 40 mg by mouth 2 (two) times daily.     traMADol 50 MG tablet  Commonly known as:  ULTRAM  Take 1 tablet (50 mg total) by mouth every 6 (six) hours as needed for pain.     Vitamin D (Cholecalciferol) 400 UNITS Caps  Take 400 Units by mouth daily.           Follow-up Information    Follow up with Zenovia Jarred, MD On 01/25/2015.   Specialty:  General Surgery   Why:  11:40 AM   Contact information:   [address] [place] [address] [phone]       Signed: Zenovia Jarred 01/11/2015, 7:32 AM

## 2015-03-09 ENCOUNTER — Other Ambulatory Visit (HOSPITAL_COMMUNITY): Payer: Self-pay | Admitting: Family Medicine

## 2015-03-09 ENCOUNTER — Ambulatory Visit (HOSPITAL_COMMUNITY)
Admission: RE | Admit: 2015-03-09 | Discharge: 2015-03-09 | Disposition: A | Discharge status: Home / Self Care | Payer: BLUE CROSS/BLUE SHIELD | Source: Ambulatory Visit | Attending: Cardiovascular Disease | Admitting: Cardiovascular Disease

## 2015-03-09 DIAGNOSIS — M7989 Other specified soft tissue disorders: Secondary | ICD-10-CM

## 2015-03-09 DIAGNOSIS — R7989 Other specified abnormal findings of blood chemistry: Secondary | ICD-10-CM

## 2015-03-09 DIAGNOSIS — M79604 Pain in right leg: Secondary | ICD-10-CM | POA: Insufficient documentation

## 2015-03-09 DIAGNOSIS — R791 Abnormal coagulation profile: Secondary | ICD-10-CM | POA: Diagnosis not present

## 2015-04-07 ENCOUNTER — Other Ambulatory Visit: Payer: Self-pay | Admitting: Family Medicine

## 2015-04-07 ENCOUNTER — Ambulatory Visit
Admission: RE | Admit: 2015-04-07 | Discharge: 2015-04-07 | Disposition: A | Discharge status: Home / Self Care | Payer: BLUE CROSS/BLUE SHIELD | Source: Ambulatory Visit | Attending: Family Medicine | Admitting: Family Medicine

## 2015-04-07 DIAGNOSIS — R0789 Other chest pain: Secondary | ICD-10-CM

## 2015-06-23 DIAGNOSIS — Z9889 Other specified postprocedural states: Secondary | ICD-10-CM | POA: Diagnosis not present

## 2015-06-28 DIAGNOSIS — Z9889 Other specified postprocedural states: Secondary | ICD-10-CM | POA: Diagnosis not present

## 2015-07-19 DIAGNOSIS — Z9889 Other specified postprocedural states: Secondary | ICD-10-CM | POA: Diagnosis not present

## 2015-07-27 DIAGNOSIS — M62838 Other muscle spasm: Secondary | ICD-10-CM | POA: Diagnosis not present

## 2015-08-23 DIAGNOSIS — M47816 Spondylosis without myelopathy or radiculopathy, lumbar region: Secondary | ICD-10-CM | POA: Diagnosis not present

## 2015-08-29 DIAGNOSIS — M62838 Other muscle spasm: Secondary | ICD-10-CM | POA: Diagnosis not present

## 2015-08-30 DIAGNOSIS — M545 Low back pain: Secondary | ICD-10-CM | POA: Diagnosis not present

## 2015-08-30 DIAGNOSIS — M25561 Pain in right knee: Secondary | ICD-10-CM | POA: Diagnosis not present

## 2015-09-06 DIAGNOSIS — M25561 Pain in right knee: Secondary | ICD-10-CM | POA: Diagnosis not present

## 2015-09-06 DIAGNOSIS — M545 Low back pain: Secondary | ICD-10-CM | POA: Diagnosis not present

## 2015-09-11 DIAGNOSIS — M545 Low back pain: Secondary | ICD-10-CM | POA: Diagnosis not present

## 2015-09-11 DIAGNOSIS — M25561 Pain in right knee: Secondary | ICD-10-CM | POA: Diagnosis not present

## 2015-09-13 DIAGNOSIS — M545 Low back pain: Secondary | ICD-10-CM | POA: Diagnosis not present

## 2015-09-13 DIAGNOSIS — M25561 Pain in right knee: Secondary | ICD-10-CM | POA: Diagnosis not present

## 2015-09-27 DIAGNOSIS — M25561 Pain in right knee: Secondary | ICD-10-CM | POA: Diagnosis not present

## 2015-09-27 DIAGNOSIS — M545 Low back pain: Secondary | ICD-10-CM | POA: Diagnosis not present

## 2015-09-28 DIAGNOSIS — M62838 Other muscle spasm: Secondary | ICD-10-CM | POA: Diagnosis not present

## 2015-10-05 DIAGNOSIS — M545 Low back pain: Secondary | ICD-10-CM | POA: Diagnosis not present

## 2015-10-05 DIAGNOSIS — M797 Fibromyalgia: Secondary | ICD-10-CM | POA: Diagnosis not present

## 2015-10-05 DIAGNOSIS — E538 Deficiency of other specified B group vitamins: Secondary | ICD-10-CM | POA: Diagnosis not present

## 2015-10-05 DIAGNOSIS — M25561 Pain in right knee: Secondary | ICD-10-CM | POA: Diagnosis not present

## 2015-10-05 DIAGNOSIS — R5383 Other fatigue: Secondary | ICD-10-CM | POA: Diagnosis not present

## 2015-10-05 DIAGNOSIS — M549 Dorsalgia, unspecified: Secondary | ICD-10-CM | POA: Diagnosis not present

## 2015-10-06 DIAGNOSIS — M25561 Pain in right knee: Secondary | ICD-10-CM | POA: Diagnosis not present

## 2015-10-16 DIAGNOSIS — R350 Frequency of micturition: Secondary | ICD-10-CM | POA: Diagnosis not present

## 2015-10-16 DIAGNOSIS — Z113 Encounter for screening for infections with a predominantly sexual mode of transmission: Secondary | ICD-10-CM | POA: Diagnosis not present

## 2015-10-16 DIAGNOSIS — B9689 Other specified bacterial agents as the cause of diseases classified elsewhere: Secondary | ICD-10-CM | POA: Diagnosis not present

## 2015-10-16 DIAGNOSIS — G5601 Carpal tunnel syndrome, right upper limb: Secondary | ICD-10-CM | POA: Diagnosis not present

## 2015-10-16 DIAGNOSIS — N760 Acute vaginitis: Secondary | ICD-10-CM | POA: Diagnosis not present

## 2015-10-16 DIAGNOSIS — N898 Other specified noninflammatory disorders of vagina: Secondary | ICD-10-CM | POA: Diagnosis not present

## 2015-10-27 DIAGNOSIS — Z01419 Encounter for gynecological examination (general) (routine) without abnormal findings: Secondary | ICD-10-CM | POA: Diagnosis not present

## 2015-10-27 DIAGNOSIS — Z13220 Encounter for screening for lipoid disorders: Secondary | ICD-10-CM | POA: Diagnosis not present

## 2015-10-27 DIAGNOSIS — N760 Acute vaginitis: Secondary | ICD-10-CM | POA: Diagnosis not present

## 2015-10-27 DIAGNOSIS — R5383 Other fatigue: Secondary | ICD-10-CM | POA: Diagnosis not present

## 2015-10-27 DIAGNOSIS — R252 Cramp and spasm: Secondary | ICD-10-CM | POA: Diagnosis not present

## 2015-10-27 DIAGNOSIS — C541 Malignant neoplasm of endometrium: Secondary | ICD-10-CM | POA: Diagnosis not present

## 2015-10-29 DIAGNOSIS — M62838 Other muscle spasm: Secondary | ICD-10-CM | POA: Diagnosis not present

## 2015-11-15 DIAGNOSIS — M25561 Pain in right knee: Secondary | ICD-10-CM | POA: Diagnosis not present

## 2015-11-29 DIAGNOSIS — M62838 Other muscle spasm: Secondary | ICD-10-CM | POA: Diagnosis not present

## 2015-11-30 DIAGNOSIS — E538 Deficiency of other specified B group vitamins: Secondary | ICD-10-CM | POA: Diagnosis not present

## 2015-12-09 DIAGNOSIS — Z23 Encounter for immunization: Secondary | ICD-10-CM | POA: Diagnosis not present

## 2015-12-29 DIAGNOSIS — M62838 Other muscle spasm: Secondary | ICD-10-CM | POA: Diagnosis not present

## 2016-01-04 DIAGNOSIS — E538 Deficiency of other specified B group vitamins: Secondary | ICD-10-CM | POA: Diagnosis not present

## 2016-01-29 DIAGNOSIS — M25561 Pain in right knee: Secondary | ICD-10-CM | POA: Diagnosis not present

## 2016-02-07 DIAGNOSIS — M25561 Pain in right knee: Secondary | ICD-10-CM | POA: Diagnosis not present

## 2016-02-23 DIAGNOSIS — M25561 Pain in right knee: Secondary | ICD-10-CM | POA: Diagnosis not present

## 2016-02-29 DIAGNOSIS — E538 Deficiency of other specified B group vitamins: Secondary | ICD-10-CM | POA: Diagnosis not present

## 2016-04-05 DIAGNOSIS — E538 Deficiency of other specified B group vitamins: Secondary | ICD-10-CM | POA: Diagnosis not present

## 2016-06-21 ENCOUNTER — Telehealth: Payer: Self-pay | Admitting: Neurology

## 2016-06-21 NOTE — Telephone Encounter (Signed)
Patient called to schedule EMG. She had her right arm/leg by Dr. Posey Pronto in the past but was told by her orthopaedic doctor at [facility] that she needed her right arm/leg done. Please advise. Patient call back [phone].

## 2016-06-24 DIAGNOSIS — E538 Deficiency of other specified B group vitamins: Secondary | ICD-10-CM | POA: Diagnosis not present

## 2016-06-24 NOTE — Telephone Encounter (Signed)
No, patient would need to talk to her referring provider.    Donika K. Posey Pronto, DO

## 2016-06-24 NOTE — Telephone Encounter (Signed)
I called patient's PCP and ortho doctor and no one knows anything about this.  Do you know?

## 2016-07-15 DIAGNOSIS — M2042 Other hammer toe(s) (acquired), left foot: Secondary | ICD-10-CM | POA: Diagnosis not present

## 2016-07-15 DIAGNOSIS — M2041 Other hammer toe(s) (acquired), right foot: Secondary | ICD-10-CM | POA: Diagnosis not present

## 2016-08-14 DIAGNOSIS — E538 Deficiency of other specified B group vitamins: Secondary | ICD-10-CM | POA: Diagnosis not present

## 2016-08-22 DIAGNOSIS — G5602 Carpal tunnel syndrome, left upper limb: Secondary | ICD-10-CM | POA: Diagnosis not present

## 2016-08-23 ENCOUNTER — Other Ambulatory Visit: Payer: Self-pay | Admitting: *Deleted

## 2016-08-23 DIAGNOSIS — G5602 Carpal tunnel syndrome, left upper limb: Secondary | ICD-10-CM

## 2016-08-27 ENCOUNTER — Ambulatory Visit (INDEPENDENT_AMBULATORY_CARE_PROVIDER_SITE_OTHER): Payer: BLUE CROSS/BLUE SHIELD | Admitting: Neurology

## 2016-08-27 DIAGNOSIS — G5602 Carpal tunnel syndrome, left upper limb: Secondary | ICD-10-CM

## 2016-08-27 NOTE — Procedures (Signed)
[facility]  [address], [place]  [place], [place] [address] Tel: [phone] Fax:  [phone] Test Date:  08/27/2016  Patient: Anna Carroll DOB: 01/22/1956 Physician: Narda Amber, DO  Sex: Female Height: 5\' 7"  Ref Phys: Donald Prose, MD  ID#: [id number] Temp: 32.4C Technician:    Patient Complaints: This is a 61 year-old female referred left hand paresthesias.  NCV & EMG Findings: Extensive electrodiagnostic testing of the left upper extremity shows:  1. Left median sensory response is absent. Left ulnar sensory response is within normal limits. 2. Left median motor response shows markedly reduced amplitude (1.0 mV) and prolonged latency (10.3 ms); there is evidence of anomalous innervation to the abductor pollicis brevis, as noted by motor response when stimulating at the ulnar wrist, consistent with a Martin-Gruber anastomosis. The left ulnar motor responses within normal limits. 3. Moderate chronic motor axon loss changes isolated to the left abductor pollicis brevis muscle, without accompanied active denervation.  Impression: 1. Left median neuropathy at or distal to the wrist, consistent with clinical diagnosis of carpal tunnel syndrome. Overall, these findings are very severe in degree electrically. 2. Incidentally, there is a left Martin-Gruber anastomosis, a normal variant.   ___________________________ Narda Amber, DO    Nerve Conduction Studies Anti Sensory Summary Table   Site NR Peak (ms) Norm Peak (ms) P-T Amp (V) Norm P-T Amp  Left Median Anti Sensory (2nd Digit)  Wrist NR  <3.8  >10  Left Ulnar Anti Sensory (5th Digit)  Wrist    3.0 <3.2 57.7 >5   Motor Summary Table   Site NR Onset (ms) Norm Onset (ms) O-P Amp (mV) Norm O-P Amp Site1 Site2 Delta-0 (ms) Dist (cm) Vel (m/s) Norm Vel (m/s)  Left Median Motor (Abd Poll Brev)  Wrist    10.3 <4.0 1.0 >5 Elbow Wrist 5.6 28.0 50 >50  Elbow    15.9  1.3  Ulnar-wrist crossover Elbow 11.1  0.0    Ulnar-wrist crossover    4.8  5.6         Left Ulnar Motor (Abd Dig Minimi)  Wrist    2.6 <3.1 11.6 >7 B Elbow Wrist 3.7 22.0 59 >50  B Elbow    6.3  10.6  A Elbow B Elbow 1.7 10.0 59 >50  A Elbow    8.0  10.4          EMG   Side Muscle Ins Act Fibs Psw Fasc Number Recrt Dur Dur. Amp Amp. Poly Poly. Comment  Left 1stDorInt Nml Nml Nml Nml Nml Nml Nml Nml Nml Nml Nml Nml N/A  Left Abd Poll Brev Nml Nml Nml Nml 2- Rapid Many 1+ Many 1+ Some 1+ N/A  Left PronatorTeres Nml Nml Nml Nml Nml Nml Nml Nml Nml Nml Nml Nml N/A  Left Biceps Nml Nml Nml Nml Nml Nml Nml Nml Nml Nml Nml Nml N/A  Left Triceps Nml Nml Nml Nml Nml Nml Nml Nml Nml Nml Nml Nml N/A  Left Deltoid Nml Nml Nml Nml Nml Nml Nml Nml Nml Nml Nml Nml N/A      Waveforms:

## 2016-09-11 DIAGNOSIS — G5602 Carpal tunnel syndrome, left upper limb: Secondary | ICD-10-CM | POA: Diagnosis not present

## 2016-09-25 DIAGNOSIS — E538 Deficiency of other specified B group vitamins: Secondary | ICD-10-CM | POA: Diagnosis not present

## 2016-10-04 DIAGNOSIS — G5602 Carpal tunnel syndrome, left upper limb: Secondary | ICD-10-CM | POA: Diagnosis not present

## 2016-10-11 DIAGNOSIS — N6489 Other specified disorders of breast: Secondary | ICD-10-CM | POA: Diagnosis not present

## 2016-10-11 DIAGNOSIS — N6322 Unspecified lump in the left breast, upper inner quadrant: Secondary | ICD-10-CM | POA: Diagnosis not present

## 2016-10-11 DIAGNOSIS — R928 Other abnormal and inconclusive findings on diagnostic imaging of breast: Secondary | ICD-10-CM | POA: Diagnosis not present

## 2016-10-11 DIAGNOSIS — N6321 Unspecified lump in the left breast, upper outer quadrant: Secondary | ICD-10-CM | POA: Diagnosis not present

## 2016-10-16 DIAGNOSIS — G5602 Carpal tunnel syndrome, left upper limb: Secondary | ICD-10-CM | POA: Diagnosis not present

## 2016-10-25 DIAGNOSIS — Z13220 Encounter for screening for lipoid disorders: Secondary | ICD-10-CM | POA: Diagnosis not present

## 2016-10-25 DIAGNOSIS — Z0000 Encounter for general adult medical examination without abnormal findings: Secondary | ICD-10-CM | POA: Diagnosis not present

## 2016-10-29 DIAGNOSIS — Z0000 Encounter for general adult medical examination without abnormal findings: Secondary | ICD-10-CM | POA: Diagnosis not present

## 2016-10-30 DIAGNOSIS — Z01419 Encounter for gynecological examination (general) (routine) without abnormal findings: Secondary | ICD-10-CM | POA: Diagnosis not present

## 2016-10-30 DIAGNOSIS — C541 Malignant neoplasm of endometrium: Secondary | ICD-10-CM | POA: Diagnosis not present

## 2016-10-30 DIAGNOSIS — R102 Pelvic and perineal pain: Secondary | ICD-10-CM | POA: Diagnosis not present

## 2016-10-30 DIAGNOSIS — N9089 Other specified noninflammatory disorders of vulva and perineum: Secondary | ICD-10-CM | POA: Diagnosis not present

## 2016-11-14 DIAGNOSIS — G8929 Other chronic pain: Secondary | ICD-10-CM | POA: Diagnosis not present

## 2016-11-14 DIAGNOSIS — M7631 Iliotibial band syndrome, right leg: Secondary | ICD-10-CM | POA: Diagnosis not present

## 2016-11-14 DIAGNOSIS — Z9889 Other specified postprocedural states: Secondary | ICD-10-CM | POA: Diagnosis not present

## 2016-11-19 DIAGNOSIS — M25561 Pain in right knee: Secondary | ICD-10-CM | POA: Diagnosis not present

## 2016-11-19 DIAGNOSIS — M7631 Iliotibial band syndrome, right leg: Secondary | ICD-10-CM | POA: Diagnosis not present

## 2016-11-19 DIAGNOSIS — R29898 Other symptoms and signs involving the musculoskeletal system: Secondary | ICD-10-CM | POA: Diagnosis not present

## 2016-11-19 DIAGNOSIS — G8929 Other chronic pain: Secondary | ICD-10-CM | POA: Diagnosis not present

## 2016-11-28 DIAGNOSIS — G8929 Other chronic pain: Secondary | ICD-10-CM | POA: Diagnosis not present

## 2016-11-28 DIAGNOSIS — L722 Steatocystoma multiplex: Secondary | ICD-10-CM | POA: Diagnosis not present

## 2016-11-28 DIAGNOSIS — M7631 Iliotibial band syndrome, right leg: Secondary | ICD-10-CM | POA: Diagnosis not present

## 2016-11-28 DIAGNOSIS — N907 Vulvar cyst: Secondary | ICD-10-CM | POA: Diagnosis not present

## 2016-11-28 DIAGNOSIS — M25561 Pain in right knee: Secondary | ICD-10-CM | POA: Diagnosis not present

## 2016-11-28 DIAGNOSIS — N9089 Other specified noninflammatory disorders of vulva and perineum: Secondary | ICD-10-CM | POA: Diagnosis not present

## 2016-11-28 DIAGNOSIS — R102 Pelvic and perineal pain: Secondary | ICD-10-CM | POA: Diagnosis not present

## 2016-11-28 DIAGNOSIS — R109 Unspecified abdominal pain: Secondary | ICD-10-CM | POA: Diagnosis not present

## 2016-11-28 DIAGNOSIS — R29898 Other symptoms and signs involving the musculoskeletal system: Secondary | ICD-10-CM | POA: Diagnosis not present

## 2016-11-29 DIAGNOSIS — R109 Unspecified abdominal pain: Secondary | ICD-10-CM | POA: Diagnosis not present

## 2016-12-12 DIAGNOSIS — N907 Vulvar cyst: Secondary | ICD-10-CM | POA: Diagnosis not present

## 2016-12-12 DIAGNOSIS — G8929 Other chronic pain: Secondary | ICD-10-CM | POA: Diagnosis not present

## 2016-12-12 DIAGNOSIS — R29898 Other symptoms and signs involving the musculoskeletal system: Secondary | ICD-10-CM | POA: Diagnosis not present

## 2016-12-12 DIAGNOSIS — M7631 Iliotibial band syndrome, right leg: Secondary | ICD-10-CM | POA: Diagnosis not present

## 2016-12-12 DIAGNOSIS — M25561 Pain in right knee: Secondary | ICD-10-CM | POA: Diagnosis not present

## 2016-12-17 DIAGNOSIS — M25561 Pain in right knee: Secondary | ICD-10-CM | POA: Diagnosis not present

## 2016-12-17 DIAGNOSIS — G8929 Other chronic pain: Secondary | ICD-10-CM | POA: Diagnosis not present

## 2016-12-17 DIAGNOSIS — M7631 Iliotibial band syndrome, right leg: Secondary | ICD-10-CM | POA: Diagnosis not present

## 2016-12-17 DIAGNOSIS — R29898 Other symptoms and signs involving the musculoskeletal system: Secondary | ICD-10-CM | POA: Diagnosis not present

## 2016-12-25 DIAGNOSIS — R1310 Dysphagia, unspecified: Secondary | ICD-10-CM | POA: Diagnosis not present

## 2016-12-25 DIAGNOSIS — K219 Gastro-esophageal reflux disease without esophagitis: Secondary | ICD-10-CM | POA: Diagnosis not present

## 2016-12-25 DIAGNOSIS — R1030 Lower abdominal pain, unspecified: Secondary | ICD-10-CM | POA: Diagnosis not present

## 2017-01-06 DIAGNOSIS — G8929 Other chronic pain: Secondary | ICD-10-CM | POA: Diagnosis not present

## 2017-01-06 DIAGNOSIS — M25561 Pain in right knee: Secondary | ICD-10-CM | POA: Diagnosis not present

## 2017-01-06 DIAGNOSIS — R29898 Other symptoms and signs involving the musculoskeletal system: Secondary | ICD-10-CM | POA: Diagnosis not present

## 2017-01-06 DIAGNOSIS — M7631 Iliotibial band syndrome, right leg: Secondary | ICD-10-CM | POA: Diagnosis not present

## 2017-01-13 DIAGNOSIS — M7631 Iliotibial band syndrome, right leg: Secondary | ICD-10-CM | POA: Diagnosis not present

## 2017-01-13 DIAGNOSIS — R29898 Other symptoms and signs involving the musculoskeletal system: Secondary | ICD-10-CM | POA: Diagnosis not present

## 2017-01-13 DIAGNOSIS — M25561 Pain in right knee: Secondary | ICD-10-CM | POA: Diagnosis not present

## 2017-01-13 DIAGNOSIS — G8929 Other chronic pain: Secondary | ICD-10-CM | POA: Diagnosis not present

## 2017-01-20 DIAGNOSIS — G8929 Other chronic pain: Secondary | ICD-10-CM | POA: Diagnosis not present

## 2017-01-20 DIAGNOSIS — R29898 Other symptoms and signs involving the musculoskeletal system: Secondary | ICD-10-CM | POA: Diagnosis not present

## 2017-01-20 DIAGNOSIS — M7631 Iliotibial band syndrome, right leg: Secondary | ICD-10-CM | POA: Diagnosis not present

## 2017-01-20 DIAGNOSIS — M25561 Pain in right knee: Secondary | ICD-10-CM | POA: Diagnosis not present

## 2017-01-24 DIAGNOSIS — Z9884 Bariatric surgery status: Secondary | ICD-10-CM | POA: Diagnosis not present

## 2017-01-24 DIAGNOSIS — Z1211 Encounter for screening for malignant neoplasm of colon: Secondary | ICD-10-CM | POA: Diagnosis not present

## 2017-01-24 DIAGNOSIS — K219 Gastro-esophageal reflux disease without esophagitis: Secondary | ICD-10-CM | POA: Diagnosis not present

## 2017-01-24 DIAGNOSIS — K229 Disease of esophagus, unspecified: Secondary | ICD-10-CM | POA: Diagnosis not present

## 2017-01-24 DIAGNOSIS — K208 Other esophagitis: Secondary | ICD-10-CM | POA: Diagnosis not present

## 2017-01-24 DIAGNOSIS — R1310 Dysphagia, unspecified: Secondary | ICD-10-CM | POA: Diagnosis not present

## 2017-01-24 DIAGNOSIS — K641 Second degree hemorrhoids: Secondary | ICD-10-CM | POA: Diagnosis not present

## 2017-01-24 DIAGNOSIS — K209 Esophagitis, unspecified: Secondary | ICD-10-CM | POA: Diagnosis not present

## 2017-02-03 DIAGNOSIS — G5602 Carpal tunnel syndrome, left upper limb: Secondary | ICD-10-CM | POA: Diagnosis not present

## 2017-02-11 DIAGNOSIS — G8929 Other chronic pain: Secondary | ICD-10-CM | POA: Diagnosis not present

## 2017-02-11 DIAGNOSIS — M25561 Pain in right knee: Secondary | ICD-10-CM | POA: Diagnosis not present

## 2017-02-11 DIAGNOSIS — M7631 Iliotibial band syndrome, right leg: Secondary | ICD-10-CM | POA: Diagnosis not present

## 2017-02-11 DIAGNOSIS — R29898 Other symptoms and signs involving the musculoskeletal system: Secondary | ICD-10-CM | POA: Diagnosis not present

## 2017-02-28 DIAGNOSIS — E538 Deficiency of other specified B group vitamins: Secondary | ICD-10-CM | POA: Diagnosis not present

## 2017-04-03 DIAGNOSIS — E538 Deficiency of other specified B group vitamins: Secondary | ICD-10-CM | POA: Diagnosis not present

## 2017-05-22 DIAGNOSIS — H5203 Hypermetropia, bilateral: Secondary | ICD-10-CM | POA: Diagnosis not present

## 2017-06-20 DIAGNOSIS — Z01812 Encounter for preprocedural laboratory examination: Secondary | ICD-10-CM | POA: Diagnosis not present

## 2017-06-23 DIAGNOSIS — R1030 Lower abdominal pain, unspecified: Secondary | ICD-10-CM | POA: Diagnosis not present

## 2017-06-25 DIAGNOSIS — K219 Gastro-esophageal reflux disease without esophagitis: Secondary | ICD-10-CM | POA: Diagnosis not present

## 2017-06-25 DIAGNOSIS — K449 Diaphragmatic hernia without obstruction or gangrene: Secondary | ICD-10-CM | POA: Diagnosis not present

## 2017-07-11 DIAGNOSIS — C541 Malignant neoplasm of endometrium: Secondary | ICD-10-CM | POA: Diagnosis not present

## 2017-07-11 DIAGNOSIS — R1030 Lower abdominal pain, unspecified: Secondary | ICD-10-CM | POA: Diagnosis not present

## 2017-07-11 DIAGNOSIS — R102 Pelvic and perineal pain: Secondary | ICD-10-CM | POA: Diagnosis not present

## 2017-07-11 DIAGNOSIS — Z9884 Bariatric surgery status: Secondary | ICD-10-CM | POA: Diagnosis not present

## 2017-09-12 IMAGING — RF DG CHOLANGIOGRAM OPERATIVE
1 series · 4 of 4 positions shown · non-contrast
Comparison: Abdominal ultrasound on 12/30/2014

CLINICAL DATA: Cholecystectomy for chronic cholecystitis with
cholelithiasis.

EXAM:
INTRAOPERATIVE CHOLANGIOGRAM
TECHNIQUE: Cholangiographic images from the C-arm fluoroscopic device were
submitted for interpretation post-operatively. Please see the
procedural report for the amount of contrast and the fluoroscopy
time utilized.

[Series 1: run · 4 of 85 frames shown]
[frame 13/85]
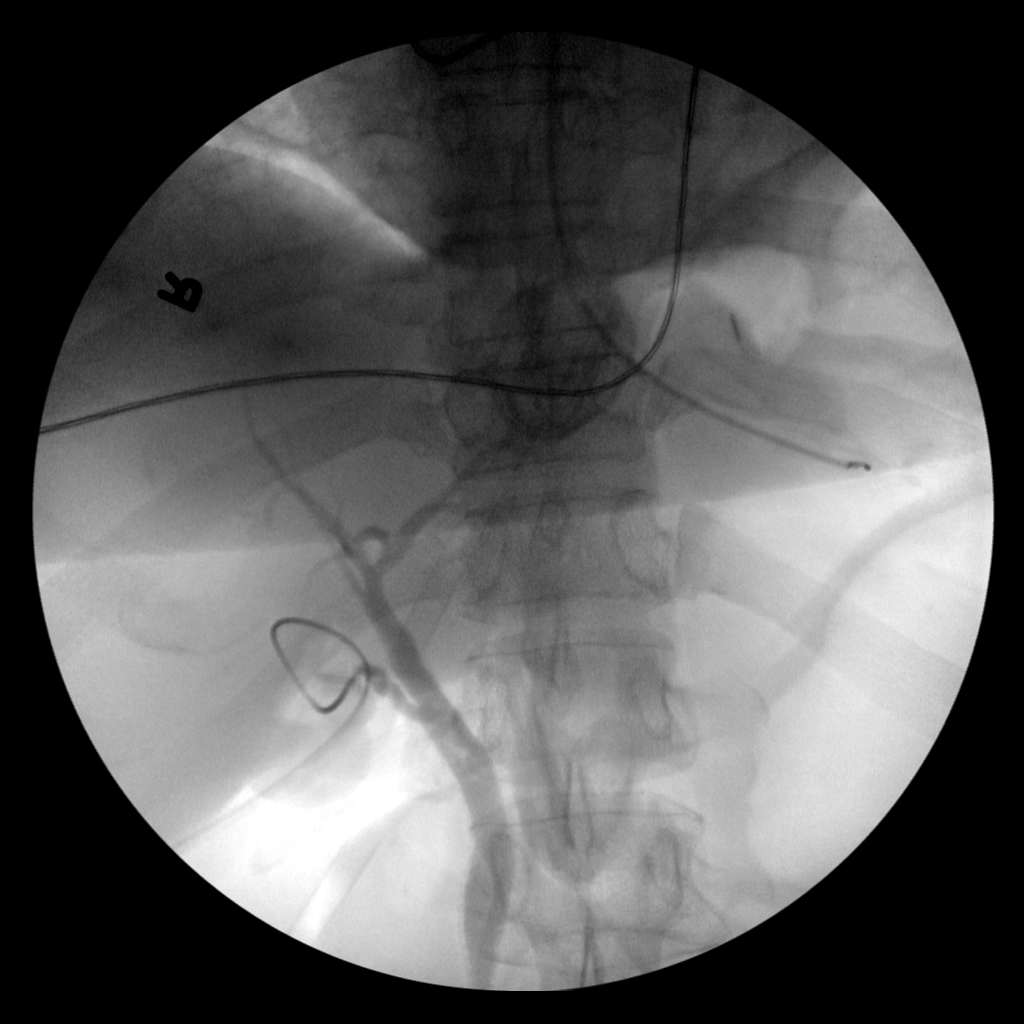
[frame 33/85]
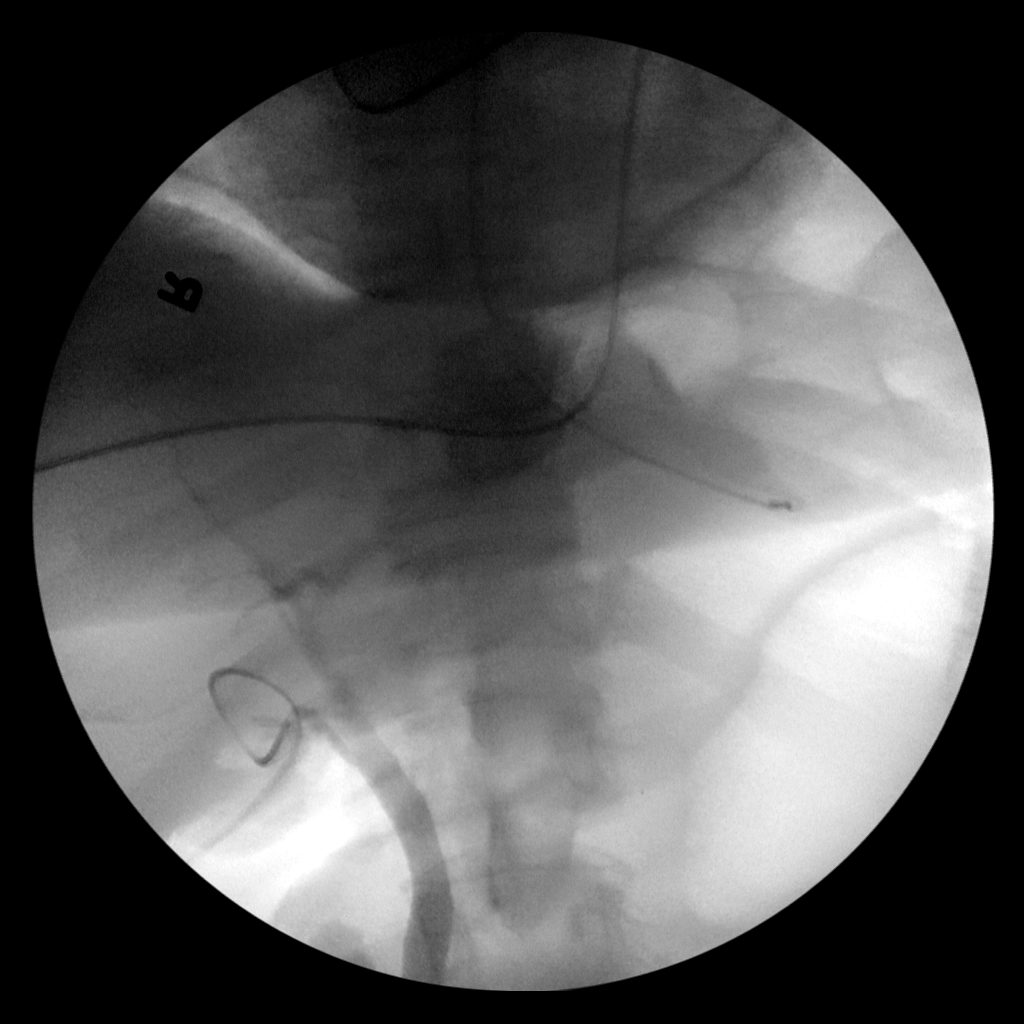
[frame 43/85]
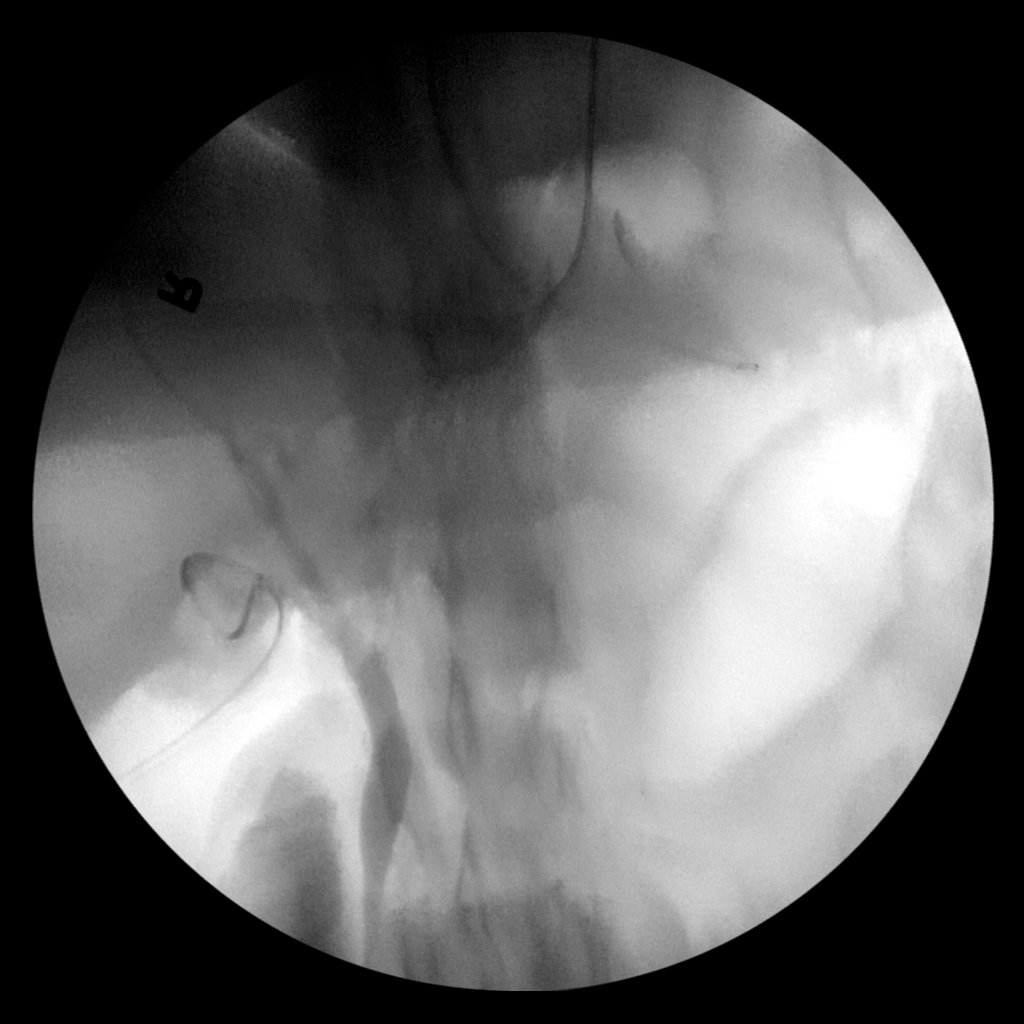
[frame 73/85]
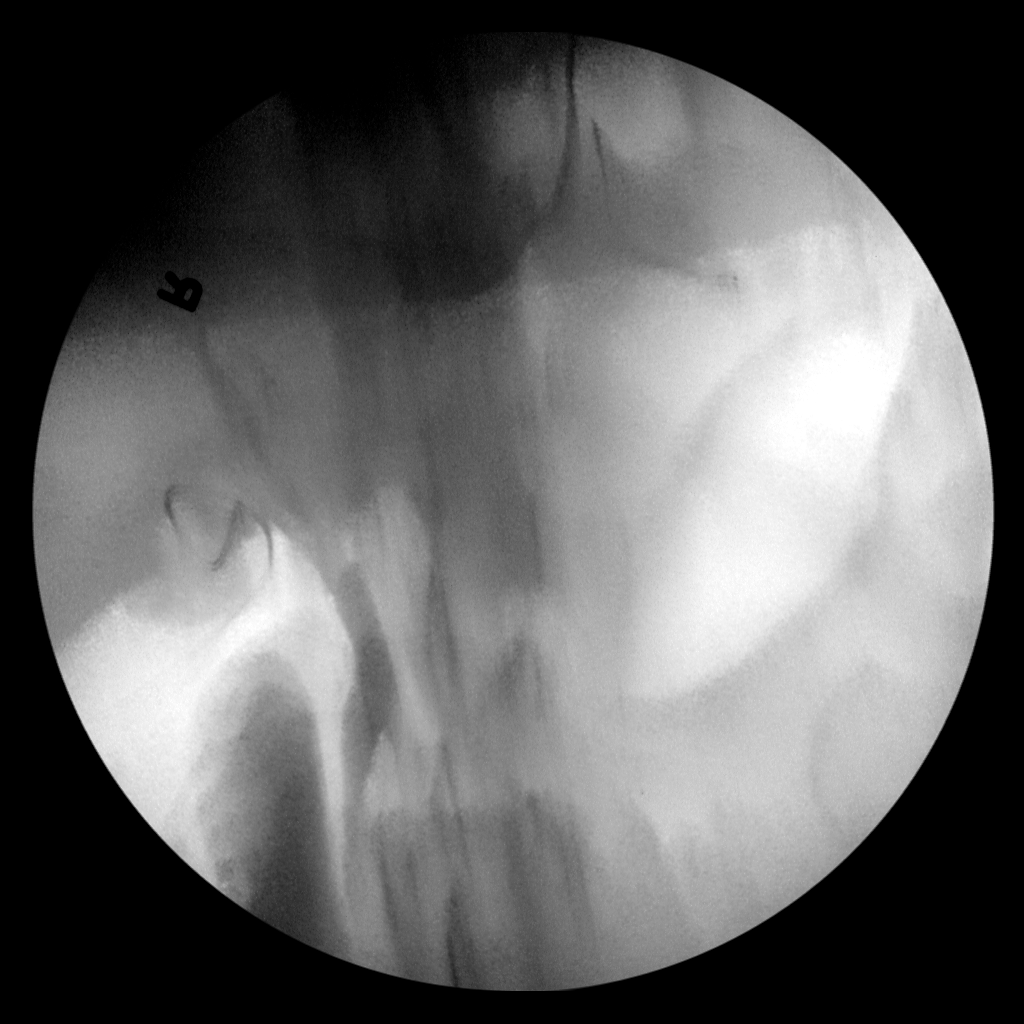

[4 of 4 positions shown; findings below may reference images not displayed]

FINDINGS: Intraoperative imaging with a C-arm demonstrates a normal opacified
biliary tree without evidence of filling defect, biliary obstruction
or contrast extravasation. Contrast is seen to enter the duodenum
normally.
IMPRESSION: Normal intraoperative cholangiogram.

## 2017-10-31 DIAGNOSIS — Z131 Encounter for screening for diabetes mellitus: Secondary | ICD-10-CM | POA: Diagnosis not present

## 2017-10-31 DIAGNOSIS — Z1321 Encounter for screening for nutritional disorder: Secondary | ICD-10-CM | POA: Diagnosis not present

## 2017-10-31 DIAGNOSIS — C541 Malignant neoplasm of endometrium: Secondary | ICD-10-CM | POA: Diagnosis not present

## 2017-10-31 DIAGNOSIS — R21 Rash and other nonspecific skin eruption: Secondary | ICD-10-CM | POA: Diagnosis not present

## 2017-10-31 DIAGNOSIS — R5382 Chronic fatigue, unspecified: Secondary | ICD-10-CM | POA: Diagnosis not present

## 2017-10-31 DIAGNOSIS — Z130 Encounter for screening for diseases of the blood and blood-forming organs and certain disorders involving the immune mechanism: Secondary | ICD-10-CM | POA: Diagnosis not present

## 2017-10-31 DIAGNOSIS — N760 Acute vaginitis: Secondary | ICD-10-CM | POA: Diagnosis not present

## 2017-10-31 DIAGNOSIS — Z13220 Encounter for screening for lipoid disorders: Secondary | ICD-10-CM | POA: Diagnosis not present

## 2017-10-31 DIAGNOSIS — Z1329 Encounter for screening for other suspected endocrine disorder: Secondary | ICD-10-CM | POA: Diagnosis not present

## 2017-10-31 DIAGNOSIS — Z01419 Encounter for gynecological examination (general) (routine) without abnormal findings: Secondary | ICD-10-CM | POA: Diagnosis not present

## 2017-10-31 DIAGNOSIS — Z23 Encounter for immunization: Secondary | ICD-10-CM | POA: Diagnosis not present

## 2017-10-31 DIAGNOSIS — Z0000 Encounter for general adult medical examination without abnormal findings: Secondary | ICD-10-CM | POA: Diagnosis not present

## 2017-12-15 ENCOUNTER — Encounter: Payer: Self-pay | Admitting: Skilled Nursing Facility1

## 2017-12-15 ENCOUNTER — Encounter: Payer: BLUE CROSS/BLUE SHIELD | Attending: Family Medicine | Admitting: Skilled Nursing Facility1

## 2017-12-15 DIAGNOSIS — Z9884 Bariatric surgery status: Secondary | ICD-10-CM | POA: Insufficient documentation

## 2017-12-15 DIAGNOSIS — Z713 Dietary counseling and surveillance: Secondary | ICD-10-CM | POA: Insufficient documentation

## 2017-12-15 DIAGNOSIS — R7303 Prediabetes: Secondary | ICD-10-CM | POA: Insufficient documentation

## 2017-12-15 DIAGNOSIS — E669 Obesity, unspecified: Secondary | ICD-10-CM

## 2017-12-15 NOTE — Patient Instructions (Addendum)
-  [facility] or [facility]  Physical activity: 3 days a week minimum of 30 minutes at a time  Resistance at least 2-3 days a Week   -Aim for at least 2 ounces of protein food per meal (at least 4 times a day)   -Google [employer]  -Google silver sneakers  -Aim to stick to 4 cookies a day and 1 latte a week cut in half   -Do not weigh yourself for the next 3 months
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
## 2018-02-11 DIAGNOSIS — R7303 Prediabetes: Secondary | ICD-10-CM | POA: Diagnosis not present

## 2018-02-11 DIAGNOSIS — G5601 Carpal tunnel syndrome, right upper limb: Secondary | ICD-10-CM | POA: Diagnosis not present

## 2018-02-11 DIAGNOSIS — R55 Syncope and collapse: Secondary | ICD-10-CM | POA: Diagnosis not present

## 2018-02-11 DIAGNOSIS — R531 Weakness: Secondary | ICD-10-CM | POA: Diagnosis not present

## 2018-03-12 ENCOUNTER — Ambulatory Visit: Payer: BLUE CROSS/BLUE SHIELD | Admitting: Skilled Nursing Facility1

## 2018-04-24 DIAGNOSIS — M797 Fibromyalgia: Secondary | ICD-10-CM | POA: Diagnosis not present

## 2018-04-24 DIAGNOSIS — J111 Influenza due to unidentified influenza virus with other respiratory manifestations: Secondary | ICD-10-CM | POA: Diagnosis not present

## 2018-04-24 DIAGNOSIS — M545 Low back pain: Secondary | ICD-10-CM | POA: Diagnosis not present

## 2018-09-09 DIAGNOSIS — S93401A Sprain of unspecified ligament of right ankle, initial encounter: Secondary | ICD-10-CM | POA: Diagnosis not present

## 2018-10-15 DIAGNOSIS — H43811 Vitreous degeneration, right eye: Secondary | ICD-10-CM | POA: Diagnosis not present

## 2018-11-02 DIAGNOSIS — Z8249 Family history of ischemic heart disease and other diseases of the circulatory system: Secondary | ICD-10-CM | POA: Diagnosis not present

## 2018-11-02 DIAGNOSIS — Z13220 Encounter for screening for lipoid disorders: Secondary | ICD-10-CM | POA: Diagnosis not present

## 2018-11-02 DIAGNOSIS — Z6831 Body mass index (BMI) 31.0-31.9, adult: Secondary | ICD-10-CM | POA: Diagnosis not present

## 2018-11-02 DIAGNOSIS — E538 Deficiency of other specified B group vitamins: Secondary | ICD-10-CM | POA: Diagnosis not present

## 2018-11-02 DIAGNOSIS — R7303 Prediabetes: Secondary | ICD-10-CM | POA: Diagnosis not present

## 2018-11-02 DIAGNOSIS — M545 Low back pain: Secondary | ICD-10-CM | POA: Diagnosis not present

## 2018-11-02 DIAGNOSIS — E559 Vitamin D deficiency, unspecified: Secondary | ICD-10-CM | POA: Diagnosis not present

## 2018-11-02 DIAGNOSIS — L821 Other seborrheic keratosis: Secondary | ICD-10-CM | POA: Diagnosis not present

## 2018-11-02 DIAGNOSIS — Z9884 Bariatric surgery status: Secondary | ICD-10-CM | POA: Diagnosis not present

## 2018-11-02 DIAGNOSIS — Z0000 Encounter for general adult medical examination without abnormal findings: Secondary | ICD-10-CM | POA: Diagnosis not present

## 2018-12-02 DIAGNOSIS — Z7189 Other specified counseling: Secondary | ICD-10-CM | POA: Diagnosis not present

## 2018-12-02 DIAGNOSIS — Z20828 Contact with and (suspected) exposure to other viral communicable diseases: Secondary | ICD-10-CM | POA: Diagnosis not present

## 2018-12-09 DIAGNOSIS — Z23 Encounter for immunization: Secondary | ICD-10-CM | POA: Diagnosis not present

## 2018-12-29 DIAGNOSIS — N761 Subacute and chronic vaginitis: Secondary | ICD-10-CM | POA: Diagnosis not present

## 2018-12-29 DIAGNOSIS — Z124 Encounter for screening for malignant neoplasm of cervix: Secondary | ICD-10-CM | POA: Diagnosis not present

## 2018-12-29 DIAGNOSIS — Z8544 Personal history of malignant neoplasm of other female genital organs: Secondary | ICD-10-CM | POA: Diagnosis not present

## 2018-12-29 DIAGNOSIS — Z01419 Encounter for gynecological examination (general) (routine) without abnormal findings: Secondary | ICD-10-CM | POA: Diagnosis not present

## 2018-12-29 DIAGNOSIS — R799 Abnormal finding of blood chemistry, unspecified: Secondary | ICD-10-CM | POA: Diagnosis not present

## 2019-01-01 ENCOUNTER — Other Ambulatory Visit: Payer: Self-pay

## 2019-01-01 ENCOUNTER — Telehealth: Payer: Self-pay | Admitting: Hematology & Oncology

## 2019-01-01 NOTE — Telephone Encounter (Signed)
Spoke with patient to confirm new patient appt date/time 11/9 at 130 pm

## 2019-01-21 ENCOUNTER — Telehealth: Payer: Self-pay | Admitting: *Deleted

## 2019-01-21 DIAGNOSIS — M25551 Pain in right hip: Secondary | ICD-10-CM | POA: Diagnosis not present

## 2019-01-21 DIAGNOSIS — M7071 Other bursitis of hip, right hip: Secondary | ICD-10-CM | POA: Diagnosis not present

## 2019-01-21 NOTE — Telephone Encounter (Signed)
Message received from patient stating that she had a missed call from this office.  Call placed back to patient and patient notified that no calls were made from this office and that call was most likely automated appointment reminder for appt on Monday, 01/25/19.  Pt appreciative of call and states that she will be here at 1:30PM on Monday, 01/25/19.

## 2019-01-25 ENCOUNTER — Other Ambulatory Visit: Payer: Self-pay

## 2019-01-25 ENCOUNTER — Inpatient Hospital Stay: Payer: BC Managed Care – PPO

## 2019-01-25 ENCOUNTER — Encounter: Payer: Self-pay | Admitting: Hematology & Oncology

## 2019-01-25 ENCOUNTER — Inpatient Hospital Stay: Payer: BC Managed Care – PPO | Attending: Hematology & Oncology | Admitting: Hematology & Oncology

## 2019-01-25 VITALS — BP 124/75 | HR 63 | Temp 97.5°F | Resp 18 | Ht 67.0 in | Wt 196.5 lb

## 2019-01-25 DIAGNOSIS — D508 Other iron deficiency anemias: Secondary | ICD-10-CM

## 2019-01-25 DIAGNOSIS — K219 Gastro-esophageal reflux disease without esophagitis: Secondary | ICD-10-CM | POA: Insufficient documentation

## 2019-01-25 DIAGNOSIS — Z90710 Acquired absence of both cervix and uterus: Secondary | ICD-10-CM | POA: Insufficient documentation

## 2019-01-25 DIAGNOSIS — Z8542 Personal history of malignant neoplasm of other parts of uterus: Secondary | ICD-10-CM | POA: Insufficient documentation

## 2019-01-25 DIAGNOSIS — M797 Fibromyalgia: Secondary | ICD-10-CM | POA: Insufficient documentation

## 2019-01-25 DIAGNOSIS — Z9013 Acquired absence of bilateral breasts and nipples: Secondary | ICD-10-CM | POA: Diagnosis not present

## 2019-01-25 DIAGNOSIS — Z9884 Bariatric surgery status: Secondary | ICD-10-CM | POA: Insufficient documentation

## 2019-01-25 DIAGNOSIS — D510 Vitamin B12 deficiency anemia due to intrinsic factor deficiency: Secondary | ICD-10-CM

## 2019-01-25 LAB — CMP (CANCER CENTER ONLY)
ALT: 10 U/L (ref 0–44)
AST: 13 U/L — ABNORMAL LOW (ref 15–41)
Albumin: 4.3 g/dL (ref 3.5–5.0)
Alkaline Phosphatase: 67 U/L (ref 38–126)
Anion gap: 5 (ref 5–15)
BUN: 6 mg/dL — ABNORMAL LOW (ref 8–23)
CO2: 31 mmol/L (ref 22–32)
Calcium: 9.7 mg/dL (ref 8.9–10.3)
Chloride: 104 mmol/L (ref 98–111)
Creatinine: 0.87 mg/dL (ref 0.44–1.00)
GFR, Est AFR Am: >60 mL/min (ref >60)
GFR, Estimated: >60 mL/min (ref >60)
Glucose, Bld: 99 mg/dL (ref 70–99)
Potassium: 4.7 mmol/L (ref 3.5–5.1)
Sodium: 140 mmol/L (ref 135–145)
Total Bilirubin: 0.4 mg/dL (ref 0.3–1.2)
Total Protein: 6.8 g/dL (ref 6.5–8.1)

## 2019-01-25 LAB — CBC WITH DIFFERENTIAL (CANCER CENTER ONLY)
Abs Immature Granulocytes: 0.01 10*3/uL (ref 0.00–0.07)
Basophils Absolute: 0 10*3/uL (ref 0.0–0.1)
Basophils Relative: 0 %
Eosinophils Absolute: 0.1 10*3/uL (ref 0.0–0.5)
Eosinophils Relative: 1 %
HCT: 39.6 % (ref 36.0–46.0)
Hemoglobin: 12.5 g/dL (ref 12.0–15.0)
Immature Granulocytes: 0 %
Lymphocytes Relative: 34 %
Lymphs Abs: 2.3 10*3/uL (ref 0.7–4.0)
MCH: 29.1 pg (ref 26.0–34.0)
MCHC: 31.6 g/dL (ref 30.0–36.0)
MCV: 92.1 fL (ref 80.0–100.0)
Monocytes Absolute: 0.5 10*3/uL (ref 0.1–1.0)
Monocytes Relative: 8 %
Neutro Abs: 3.9 10*3/uL (ref 1.7–7.7)
Neutrophils Relative %: 57 %
Platelet Count: 221 10*3/uL (ref 150–400)
RBC: 4.3 MIL/uL (ref 3.87–5.11)
RDW: 13.3 % (ref 11.5–15.5)
WBC Count: 6.8 10*3/uL (ref 4.0–10.5)
nRBC: 0 % (ref 0.0–0.2)

## 2019-01-25 LAB — RETICULOCYTES
Immature Retic Fract: 6.8 % (ref 2.3–15.9)
RBC.: 4.3 MIL/uL (ref 3.87–5.11)
Retic Count, Absolute: 58.5 10*3/uL (ref 19.0–186.0)
Retic Ct Pct: 1.4 % (ref 0.4–3.1)

## 2019-01-25 LAB — SAVE SMEAR (SSMR)

## 2019-01-25 LAB — VITAMIN B12: Vitamin B-12: 382 pg/mL (ref 180–914)
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
## 2019-01-26 ENCOUNTER — Telehealth: Payer: Self-pay | Admitting: Hematology & Oncology

## 2019-01-26 LAB — FERRITIN: Ferritin: 71 ng/mL (ref 11–307)

## 2019-01-26 LAB — IRON AND TIBC
Iron: 83 ug/dL (ref 41–142)
Saturation Ratios: 27 % (ref 21–57)
TIBC: 306 ug/dL (ref 236–444)
UIBC: 223 ug/dL (ref 120–384)

## 2019-01-26 NOTE — Telephone Encounter (Signed)
NO LOS FOR 11/9 DOS

## 2019-04-23 DIAGNOSIS — Z1231 Encounter for screening mammogram for malignant neoplasm of breast: Secondary | ICD-10-CM | POA: Diagnosis not present

## 2019-05-27 ENCOUNTER — Ambulatory Visit: Payer: BC Managed Care – PPO

## 2019-07-14 DIAGNOSIS — F329 Major depressive disorder, single episode, unspecified: Secondary | ICD-10-CM | POA: Diagnosis not present

## 2019-07-14 DIAGNOSIS — E559 Vitamin D deficiency, unspecified: Secondary | ICD-10-CM | POA: Diagnosis not present

## 2019-07-14 DIAGNOSIS — M79604 Pain in right leg: Secondary | ICD-10-CM | POA: Diagnosis not present

## 2019-07-14 DIAGNOSIS — M545 Low back pain: Secondary | ICD-10-CM | POA: Diagnosis not present

## 2019-07-20 DIAGNOSIS — M549 Dorsalgia, unspecified: Secondary | ICD-10-CM | POA: Diagnosis not present

## 2019-10-13 DIAGNOSIS — F411 Generalized anxiety disorder: Secondary | ICD-10-CM | POA: Diagnosis not present

## 2019-11-03 DIAGNOSIS — E538 Deficiency of other specified B group vitamins: Secondary | ICD-10-CM | POA: Diagnosis not present

## 2019-11-03 DIAGNOSIS — Z0000 Encounter for general adult medical examination without abnormal findings: Secondary | ICD-10-CM | POA: Diagnosis not present

## 2019-11-03 DIAGNOSIS — E559 Vitamin D deficiency, unspecified: Secondary | ICD-10-CM | POA: Diagnosis not present

## 2019-11-03 DIAGNOSIS — M797 Fibromyalgia: Secondary | ICD-10-CM | POA: Diagnosis not present

## 2019-11-03 DIAGNOSIS — Z13220 Encounter for screening for lipoid disorders: Secondary | ICD-10-CM | POA: Diagnosis not present

## 2019-11-03 DIAGNOSIS — F411 Generalized anxiety disorder: Secondary | ICD-10-CM | POA: Diagnosis not present

## 2019-11-03 DIAGNOSIS — R7303 Prediabetes: Secondary | ICD-10-CM | POA: Diagnosis not present

## 2019-12-08 DIAGNOSIS — Z01419 Encounter for gynecological examination (general) (routine) without abnormal findings: Secondary | ICD-10-CM | POA: Diagnosis not present

## 2019-12-08 DIAGNOSIS — Z8542 Personal history of malignant neoplasm of other parts of uterus: Secondary | ICD-10-CM | POA: Diagnosis not present

## 2019-12-08 DIAGNOSIS — Z8481 Family history of carrier of genetic disease: Secondary | ICD-10-CM | POA: Diagnosis not present

## 2019-12-08 DIAGNOSIS — Z90710 Acquired absence of both cervix and uterus: Secondary | ICD-10-CM | POA: Diagnosis not present

## 2019-12-08 DIAGNOSIS — Z1151 Encounter for screening for human papillomavirus (HPV): Secondary | ICD-10-CM | POA: Diagnosis not present

## 2020-05-12 DIAGNOSIS — F411 Generalized anxiety disorder: Secondary | ICD-10-CM | POA: Diagnosis not present

## 2020-05-12 DIAGNOSIS — M797 Fibromyalgia: Secondary | ICD-10-CM | POA: Diagnosis not present

## 2020-11-06 DIAGNOSIS — N6002 Solitary cyst of left breast: Secondary | ICD-10-CM | POA: Diagnosis not present

## 2020-11-06 DIAGNOSIS — R928 Other abnormal and inconclusive findings on diagnostic imaging of breast: Secondary | ICD-10-CM | POA: Diagnosis not present

## 2020-11-14 DIAGNOSIS — Z1159 Encounter for screening for other viral diseases: Secondary | ICD-10-CM | POA: Diagnosis not present

## 2020-11-14 DIAGNOSIS — R7303 Prediabetes: Secondary | ICD-10-CM | POA: Diagnosis not present

## 2020-11-14 DIAGNOSIS — E559 Vitamin D deficiency, unspecified: Secondary | ICD-10-CM | POA: Diagnosis not present

## 2020-11-14 DIAGNOSIS — Z9884 Bariatric surgery status: Secondary | ICD-10-CM | POA: Diagnosis not present

## 2020-11-14 DIAGNOSIS — Z136 Encounter for screening for cardiovascular disorders: Secondary | ICD-10-CM | POA: Diagnosis not present

## 2020-11-14 DIAGNOSIS — Z0000 Encounter for general adult medical examination without abnormal findings: Secondary | ICD-10-CM | POA: Diagnosis not present

## 2020-11-14 DIAGNOSIS — F411 Generalized anxiety disorder: Secondary | ICD-10-CM | POA: Diagnosis not present

## 2020-12-18 DIAGNOSIS — M25551 Pain in right hip: Secondary | ICD-10-CM | POA: Diagnosis not present

## 2020-12-18 DIAGNOSIS — M25552 Pain in left hip: Secondary | ICD-10-CM | POA: Diagnosis not present

## 2020-12-22 DIAGNOSIS — M25551 Pain in right hip: Secondary | ICD-10-CM | POA: Diagnosis not present

## 2020-12-22 DIAGNOSIS — M25651 Stiffness of right hip, not elsewhere classified: Secondary | ICD-10-CM | POA: Diagnosis not present

## 2020-12-22 DIAGNOSIS — M6281 Muscle weakness (generalized): Secondary | ICD-10-CM | POA: Diagnosis not present

## 2020-12-22 DIAGNOSIS — M25552 Pain in left hip: Secondary | ICD-10-CM | POA: Diagnosis not present

## 2020-12-29 DIAGNOSIS — M25551 Pain in right hip: Secondary | ICD-10-CM | POA: Diagnosis not present

## 2020-12-29 DIAGNOSIS — M25552 Pain in left hip: Secondary | ICD-10-CM | POA: Diagnosis not present

## 2020-12-29 DIAGNOSIS — M25651 Stiffness of right hip, not elsewhere classified: Secondary | ICD-10-CM | POA: Diagnosis not present

## 2020-12-29 DIAGNOSIS — M6281 Muscle weakness (generalized): Secondary | ICD-10-CM | POA: Diagnosis not present

## 2021-01-01 ENCOUNTER — Ambulatory Visit
Admission: RE | Admit: 2021-01-01 | Discharge: 2021-01-01 | Disposition: A | Discharge status: Home / Self Care | Payer: Medicare Other | Source: Ambulatory Visit | Attending: Sports Medicine | Admitting: Sports Medicine

## 2021-01-01 ENCOUNTER — Other Ambulatory Visit: Payer: Self-pay | Admitting: Sports Medicine

## 2021-01-01 DIAGNOSIS — M25551 Pain in right hip: Secondary | ICD-10-CM

## 2021-01-01 DIAGNOSIS — M79604 Pain in right leg: Secondary | ICD-10-CM

## 2021-01-01 DIAGNOSIS — M79605 Pain in left leg: Secondary | ICD-10-CM | POA: Diagnosis not present

## 2021-01-01 DIAGNOSIS — M25552 Pain in left hip: Secondary | ICD-10-CM

## 2021-01-16 DIAGNOSIS — M25551 Pain in right hip: Secondary | ICD-10-CM | POA: Diagnosis not present

## 2021-01-16 DIAGNOSIS — M25552 Pain in left hip: Secondary | ICD-10-CM | POA: Diagnosis not present

## 2021-01-23 DIAGNOSIS — M25551 Pain in right hip: Secondary | ICD-10-CM | POA: Diagnosis not present

## 2021-01-23 DIAGNOSIS — M25552 Pain in left hip: Secondary | ICD-10-CM | POA: Diagnosis not present

## 2021-01-30 DIAGNOSIS — M25551 Pain in right hip: Secondary | ICD-10-CM | POA: Diagnosis not present

## 2021-01-30 DIAGNOSIS — M25552 Pain in left hip: Secondary | ICD-10-CM | POA: Diagnosis not present

## 2021-01-31 DIAGNOSIS — M1711 Unilateral primary osteoarthritis, right knee: Secondary | ICD-10-CM | POA: Diagnosis not present

## 2021-01-31 DIAGNOSIS — M25551 Pain in right hip: Secondary | ICD-10-CM | POA: Diagnosis not present

## 2021-02-13 DIAGNOSIS — M25552 Pain in left hip: Secondary | ICD-10-CM | POA: Diagnosis not present

## 2021-02-13 DIAGNOSIS — M25551 Pain in right hip: Secondary | ICD-10-CM | POA: Diagnosis not present

## 2021-02-27 DIAGNOSIS — Z8742 Personal history of other diseases of the female genital tract: Secondary | ICD-10-CM | POA: Diagnosis not present

## 2021-02-27 DIAGNOSIS — Z90710 Acquired absence of both cervix and uterus: Secondary | ICD-10-CM | POA: Diagnosis not present

## 2021-02-27 DIAGNOSIS — Z9079 Acquired absence of other genital organ(s): Secondary | ICD-10-CM | POA: Diagnosis not present

## 2021-02-27 DIAGNOSIS — Z01419 Encounter for gynecological examination (general) (routine) without abnormal findings: Secondary | ICD-10-CM | POA: Diagnosis not present

## 2021-02-27 DIAGNOSIS — Z8481 Family history of carrier of genetic disease: Secondary | ICD-10-CM | POA: Diagnosis not present

## 2021-02-27 DIAGNOSIS — Z1272 Encounter for screening for malignant neoplasm of vagina: Secondary | ICD-10-CM | POA: Diagnosis not present

## 2021-02-27 DIAGNOSIS — R87811 Vaginal high risk human papillomavirus (HPV) DNA test positive: Secondary | ICD-10-CM | POA: Diagnosis not present

## 2021-02-27 DIAGNOSIS — Z1151 Encounter for screening for human papillomavirus (HPV): Secondary | ICD-10-CM | POA: Diagnosis not present

## 2021-03-15 DIAGNOSIS — H5203 Hypermetropia, bilateral: Secondary | ICD-10-CM | POA: Diagnosis not present

## 2021-03-15 DIAGNOSIS — M25551 Pain in right hip: Secondary | ICD-10-CM | POA: Diagnosis not present

## 2021-03-15 DIAGNOSIS — M25552 Pain in left hip: Secondary | ICD-10-CM | POA: Diagnosis not present

## 2021-03-22 DIAGNOSIS — M25552 Pain in left hip: Secondary | ICD-10-CM | POA: Diagnosis not present

## 2021-03-22 DIAGNOSIS — M25551 Pain in right hip: Secondary | ICD-10-CM | POA: Diagnosis not present

## 2021-03-27 DIAGNOSIS — M25552 Pain in left hip: Secondary | ICD-10-CM | POA: Diagnosis not present

## 2021-03-27 DIAGNOSIS — M25551 Pain in right hip: Secondary | ICD-10-CM | POA: Diagnosis not present

## 2021-03-29 DIAGNOSIS — M25551 Pain in right hip: Secondary | ICD-10-CM | POA: Diagnosis not present

## 2021-03-29 DIAGNOSIS — M25552 Pain in left hip: Secondary | ICD-10-CM | POA: Diagnosis not present

## 2021-04-03 DIAGNOSIS — M25551 Pain in right hip: Secondary | ICD-10-CM | POA: Diagnosis not present

## 2021-04-03 DIAGNOSIS — M25552 Pain in left hip: Secondary | ICD-10-CM | POA: Diagnosis not present

## 2021-04-10 DIAGNOSIS — M25552 Pain in left hip: Secondary | ICD-10-CM | POA: Diagnosis not present

## 2021-04-10 DIAGNOSIS — M25551 Pain in right hip: Secondary | ICD-10-CM | POA: Diagnosis not present

## 2021-04-19 DIAGNOSIS — M25552 Pain in left hip: Secondary | ICD-10-CM | POA: Diagnosis not present

## 2021-04-19 DIAGNOSIS — M25551 Pain in right hip: Secondary | ICD-10-CM | POA: Diagnosis not present

## 2021-04-26 DIAGNOSIS — M25551 Pain in right hip: Secondary | ICD-10-CM | POA: Diagnosis not present

## 2021-04-26 DIAGNOSIS — M25552 Pain in left hip: Secondary | ICD-10-CM | POA: Diagnosis not present

## 2021-05-03 DIAGNOSIS — M25552 Pain in left hip: Secondary | ICD-10-CM | POA: Diagnosis not present

## 2021-05-03 DIAGNOSIS — M25551 Pain in right hip: Secondary | ICD-10-CM | POA: Diagnosis not present

## 2021-05-04 DIAGNOSIS — F411 Generalized anxiety disorder: Secondary | ICD-10-CM | POA: Diagnosis not present

## 2021-05-04 DIAGNOSIS — R7303 Prediabetes: Secondary | ICD-10-CM | POA: Diagnosis not present

## 2021-05-04 DIAGNOSIS — E559 Vitamin D deficiency, unspecified: Secondary | ICD-10-CM | POA: Diagnosis not present

## 2021-05-10 DIAGNOSIS — M25552 Pain in left hip: Secondary | ICD-10-CM | POA: Diagnosis not present

## 2021-05-10 DIAGNOSIS — M25551 Pain in right hip: Secondary | ICD-10-CM | POA: Diagnosis not present

## 2021-05-17 DIAGNOSIS — M25551 Pain in right hip: Secondary | ICD-10-CM | POA: Diagnosis not present

## 2021-05-17 DIAGNOSIS — M25552 Pain in left hip: Secondary | ICD-10-CM | POA: Diagnosis not present

## 2021-05-23 DIAGNOSIS — M25552 Pain in left hip: Secondary | ICD-10-CM | POA: Diagnosis not present

## 2021-05-23 DIAGNOSIS — M25551 Pain in right hip: Secondary | ICD-10-CM | POA: Diagnosis not present

## 2021-08-08 DIAGNOSIS — M7711 Lateral epicondylitis, right elbow: Secondary | ICD-10-CM | POA: Diagnosis not present

## 2021-08-08 DIAGNOSIS — M25511 Pain in right shoulder: Secondary | ICD-10-CM | POA: Diagnosis not present

## 2021-08-17 DIAGNOSIS — M79601 Pain in right arm: Secondary | ICD-10-CM | POA: Diagnosis not present

## 2021-08-17 DIAGNOSIS — M5412 Radiculopathy, cervical region: Secondary | ICD-10-CM | POA: Diagnosis not present

## 2021-09-04 DIAGNOSIS — M542 Cervicalgia: Secondary | ICD-10-CM | POA: Diagnosis not present

## 2021-09-04 DIAGNOSIS — M5412 Radiculopathy, cervical region: Secondary | ICD-10-CM | POA: Diagnosis not present

## 2021-09-07 DIAGNOSIS — M5412 Radiculopathy, cervical region: Secondary | ICD-10-CM | POA: Diagnosis not present

## 2021-09-07 DIAGNOSIS — M542 Cervicalgia: Secondary | ICD-10-CM | POA: Diagnosis not present

## 2021-09-13 DIAGNOSIS — M25511 Pain in right shoulder: Secondary | ICD-10-CM | POA: Diagnosis not present

## 2021-09-25 DIAGNOSIS — M542 Cervicalgia: Secondary | ICD-10-CM | POA: Diagnosis not present

## 2021-09-25 DIAGNOSIS — M5412 Radiculopathy, cervical region: Secondary | ICD-10-CM | POA: Diagnosis not present

## 2021-09-27 DIAGNOSIS — M542 Cervicalgia: Secondary | ICD-10-CM | POA: Diagnosis not present

## 2021-09-27 DIAGNOSIS — M5412 Radiculopathy, cervical region: Secondary | ICD-10-CM | POA: Diagnosis not present

## 2021-10-04 DIAGNOSIS — M542 Cervicalgia: Secondary | ICD-10-CM | POA: Diagnosis not present

## 2021-10-04 DIAGNOSIS — M5412 Radiculopathy, cervical region: Secondary | ICD-10-CM | POA: Diagnosis not present

## 2021-10-09 DIAGNOSIS — M5412 Radiculopathy, cervical region: Secondary | ICD-10-CM | POA: Diagnosis not present

## 2021-10-09 DIAGNOSIS — M542 Cervicalgia: Secondary | ICD-10-CM | POA: Diagnosis not present

## 2021-10-11 DIAGNOSIS — M5412 Radiculopathy, cervical region: Secondary | ICD-10-CM | POA: Diagnosis not present

## 2021-10-11 DIAGNOSIS — M542 Cervicalgia: Secondary | ICD-10-CM | POA: Diagnosis not present

## 2021-10-25 DIAGNOSIS — G5761 Lesion of plantar nerve, right lower limb: Secondary | ICD-10-CM | POA: Diagnosis not present

## 2021-10-25 DIAGNOSIS — M2041 Other hammer toe(s) (acquired), right foot: Secondary | ICD-10-CM | POA: Diagnosis not present

## 2021-10-25 DIAGNOSIS — M24571 Contracture, right ankle: Secondary | ICD-10-CM | POA: Diagnosis not present

## 2021-10-25 DIAGNOSIS — M7671 Peroneal tendinitis, right leg: Secondary | ICD-10-CM | POA: Diagnosis not present

## 2021-11-15 DIAGNOSIS — Z1231 Encounter for screening mammogram for malignant neoplasm of breast: Secondary | ICD-10-CM | POA: Diagnosis not present

## 2021-11-15 DIAGNOSIS — M898X8 Other specified disorders of bone, other site: Secondary | ICD-10-CM | POA: Diagnosis not present

## 2021-11-15 DIAGNOSIS — M79671 Pain in right foot: Secondary | ICD-10-CM | POA: Diagnosis not present

## 2021-11-16 DIAGNOSIS — M24571 Contracture, right ankle: Secondary | ICD-10-CM | POA: Diagnosis not present

## 2021-11-16 DIAGNOSIS — M7671 Peroneal tendinitis, right leg: Secondary | ICD-10-CM | POA: Diagnosis not present

## 2021-11-16 DIAGNOSIS — M2041 Other hammer toe(s) (acquired), right foot: Secondary | ICD-10-CM | POA: Diagnosis not present

## 2021-11-16 DIAGNOSIS — G5761 Lesion of plantar nerve, right lower limb: Secondary | ICD-10-CM | POA: Diagnosis not present

## 2021-12-10 DIAGNOSIS — E559 Vitamin D deficiency, unspecified: Secondary | ICD-10-CM | POA: Diagnosis not present

## 2021-12-10 DIAGNOSIS — Z0000 Encounter for general adult medical examination without abnormal findings: Secondary | ICD-10-CM | POA: Diagnosis not present

## 2021-12-10 DIAGNOSIS — R7303 Prediabetes: Secondary | ICD-10-CM | POA: Diagnosis not present

## 2021-12-10 DIAGNOSIS — F411 Generalized anxiety disorder: Secondary | ICD-10-CM | POA: Diagnosis not present

## 2021-12-10 DIAGNOSIS — M797 Fibromyalgia: Secondary | ICD-10-CM | POA: Diagnosis not present

## 2021-12-19 DIAGNOSIS — E2839 Other primary ovarian failure: Secondary | ICD-10-CM | POA: Diagnosis not present

## 2021-12-19 DIAGNOSIS — Z780 Asymptomatic menopausal state: Secondary | ICD-10-CM | POA: Diagnosis not present

## 2022-01-09 DIAGNOSIS — G5761 Lesion of plantar nerve, right lower limb: Secondary | ICD-10-CM | POA: Diagnosis not present

## 2022-01-09 DIAGNOSIS — M2041 Other hammer toe(s) (acquired), right foot: Secondary | ICD-10-CM | POA: Diagnosis not present

## 2022-01-09 DIAGNOSIS — S92911A Unspecified fracture of right toe(s), initial encounter for closed fracture: Secondary | ICD-10-CM | POA: Diagnosis not present

## 2022-01-09 DIAGNOSIS — M7671 Peroneal tendinitis, right leg: Secondary | ICD-10-CM | POA: Diagnosis not present

## 2022-02-28 DIAGNOSIS — Z1151 Encounter for screening for human papillomavirus (HPV): Secondary | ICD-10-CM | POA: Diagnosis not present

## 2022-02-28 DIAGNOSIS — R399 Unspecified symptoms and signs involving the genitourinary system: Secondary | ICD-10-CM | POA: Diagnosis not present

## 2022-02-28 DIAGNOSIS — R1031 Right lower quadrant pain: Secondary | ICD-10-CM | POA: Diagnosis not present

## 2022-02-28 DIAGNOSIS — Z8742 Personal history of other diseases of the female genital tract: Secondary | ICD-10-CM | POA: Diagnosis not present

## 2022-02-28 DIAGNOSIS — Z01411 Encounter for gynecological examination (general) (routine) with abnormal findings: Secondary | ICD-10-CM | POA: Diagnosis not present

## 2022-02-28 DIAGNOSIS — Z01419 Encounter for gynecological examination (general) (routine) without abnormal findings: Secondary | ICD-10-CM | POA: Diagnosis not present

## 2022-03-15 DIAGNOSIS — H43813 Vitreous degeneration, bilateral: Secondary | ICD-10-CM | POA: Diagnosis not present

## 2022-03-15 DIAGNOSIS — H5203 Hypermetropia, bilateral: Secondary | ICD-10-CM | POA: Diagnosis not present

## 2022-03-20 DIAGNOSIS — Z90710 Acquired absence of both cervix and uterus: Secondary | ICD-10-CM | POA: Diagnosis not present

## 2022-03-20 DIAGNOSIS — R109 Unspecified abdominal pain: Secondary | ICD-10-CM | POA: Diagnosis not present

## 2022-03-20 DIAGNOSIS — R350 Frequency of micturition: Secondary | ICD-10-CM | POA: Diagnosis not present

## 2022-03-20 DIAGNOSIS — R1031 Right lower quadrant pain: Secondary | ICD-10-CM | POA: Diagnosis not present

## 2022-03-20 DIAGNOSIS — Z8542 Personal history of malignant neoplasm of other parts of uterus: Secondary | ICD-10-CM | POA: Diagnosis not present

## 2022-06-26 DIAGNOSIS — F411 Generalized anxiety disorder: Secondary | ICD-10-CM | POA: Diagnosis not present

## 2022-06-26 DIAGNOSIS — M797 Fibromyalgia: Secondary | ICD-10-CM | POA: Diagnosis not present

## 2022-06-26 DIAGNOSIS — R7303 Prediabetes: Secondary | ICD-10-CM | POA: Diagnosis not present

## 2022-07-15 DIAGNOSIS — M25561 Pain in right knee: Secondary | ICD-10-CM | POA: Diagnosis not present

## 2022-07-15 DIAGNOSIS — M25562 Pain in left knee: Secondary | ICD-10-CM | POA: Diagnosis not present

## 2022-07-15 DIAGNOSIS — M25512 Pain in left shoulder: Secondary | ICD-10-CM | POA: Diagnosis not present

## 2022-09-17 DIAGNOSIS — K080 Exfoliation of teeth due to systemic causes: Secondary | ICD-10-CM | POA: Diagnosis not present

## 2022-11-19 DIAGNOSIS — Z711 Person with feared health complaint in whom no diagnosis is made: Secondary | ICD-10-CM | POA: Diagnosis not present

## 2022-11-21 DIAGNOSIS — F3341 Major depressive disorder, recurrent, in partial remission: Secondary | ICD-10-CM | POA: Diagnosis not present

## 2022-12-13 DIAGNOSIS — K080 Exfoliation of teeth due to systemic causes: Secondary | ICD-10-CM | POA: Diagnosis not present

## 2022-12-17 DIAGNOSIS — Z0000 Encounter for general adult medical examination without abnormal findings: Secondary | ICD-10-CM | POA: Diagnosis not present

## 2022-12-17 DIAGNOSIS — Z1331 Encounter for screening for depression: Secondary | ICD-10-CM | POA: Diagnosis not present

## 2022-12-17 DIAGNOSIS — H00014 Hordeolum externum left upper eyelid: Secondary | ICD-10-CM | POA: Diagnosis not present

## 2022-12-17 DIAGNOSIS — E559 Vitamin D deficiency, unspecified: Secondary | ICD-10-CM | POA: Diagnosis not present

## 2022-12-17 DIAGNOSIS — F411 Generalized anxiety disorder: Secondary | ICD-10-CM | POA: Diagnosis not present

## 2022-12-17 DIAGNOSIS — R7303 Prediabetes: Secondary | ICD-10-CM | POA: Diagnosis not present

## 2022-12-17 DIAGNOSIS — Z23 Encounter for immunization: Secondary | ICD-10-CM | POA: Diagnosis not present

## 2023-01-02 DIAGNOSIS — L578 Other skin changes due to chronic exposure to nonionizing radiation: Secondary | ICD-10-CM | POA: Diagnosis not present

## 2023-01-02 DIAGNOSIS — L820 Inflamed seborrheic keratosis: Secondary | ICD-10-CM | POA: Diagnosis not present

## 2023-01-10 DIAGNOSIS — M722 Plantar fascial fibromatosis: Secondary | ICD-10-CM | POA: Diagnosis not present

## 2023-01-10 DIAGNOSIS — D2371 Other benign neoplasm of skin of right lower limb, including hip: Secondary | ICD-10-CM | POA: Diagnosis not present

## 2023-01-28 DIAGNOSIS — M722 Plantar fascial fibromatosis: Secondary | ICD-10-CM | POA: Diagnosis not present

## 2023-01-31 DIAGNOSIS — K080 Exfoliation of teeth due to systemic causes: Secondary | ICD-10-CM | POA: Diagnosis not present

## 2023-02-12 DIAGNOSIS — C541 Malignant neoplasm of endometrium: Secondary | ICD-10-CM | POA: Diagnosis not present

## 2023-02-12 DIAGNOSIS — L089 Local infection of the skin and subcutaneous tissue, unspecified: Secondary | ICD-10-CM | POA: Diagnosis not present

## 2023-02-12 DIAGNOSIS — Z01419 Encounter for gynecological examination (general) (routine) without abnormal findings: Secondary | ICD-10-CM | POA: Diagnosis not present

## 2023-02-12 DIAGNOSIS — L723 Sebaceous cyst: Secondary | ICD-10-CM | POA: Diagnosis not present

## 2023-02-12 DIAGNOSIS — R87811 Vaginal high risk human papillomavirus (HPV) DNA test positive: Secondary | ICD-10-CM | POA: Diagnosis not present

## 2023-03-06 DIAGNOSIS — M722 Plantar fascial fibromatosis: Secondary | ICD-10-CM | POA: Diagnosis not present

## 2023-03-17 DIAGNOSIS — H43813 Vitreous degeneration, bilateral: Secondary | ICD-10-CM | POA: Diagnosis not present

## 2023-03-17 DIAGNOSIS — H524 Presbyopia: Secondary | ICD-10-CM | POA: Diagnosis not present

## 2023-05-06 DIAGNOSIS — K080 Exfoliation of teeth due to systemic causes: Secondary | ICD-10-CM | POA: Diagnosis not present

## 2023-06-17 DIAGNOSIS — F411 Generalized anxiety disorder: Secondary | ICD-10-CM | POA: Diagnosis not present

## 2023-06-17 DIAGNOSIS — M797 Fibromyalgia: Secondary | ICD-10-CM | POA: Diagnosis not present

## 2023-06-17 DIAGNOSIS — R7303 Prediabetes: Secondary | ICD-10-CM | POA: Diagnosis not present

## 2023-06-17 DIAGNOSIS — J3489 Other specified disorders of nose and nasal sinuses: Secondary | ICD-10-CM | POA: Diagnosis not present

## 2023-07-31 DIAGNOSIS — F320 Major depressive disorder, single episode, mild: Secondary | ICD-10-CM | POA: Diagnosis not present

## 2023-09-04 IMAGING — DX DG FEMUR 2+V*L*
4 series · 4 of 4 positions shown · non-contrast
Comparison: None.

CLINICAL DATA: Bilateral leg pain

EXAM:
LEFT FEMUR 2 VIEWS

[dg femur min 2 views left (1 of 4)]
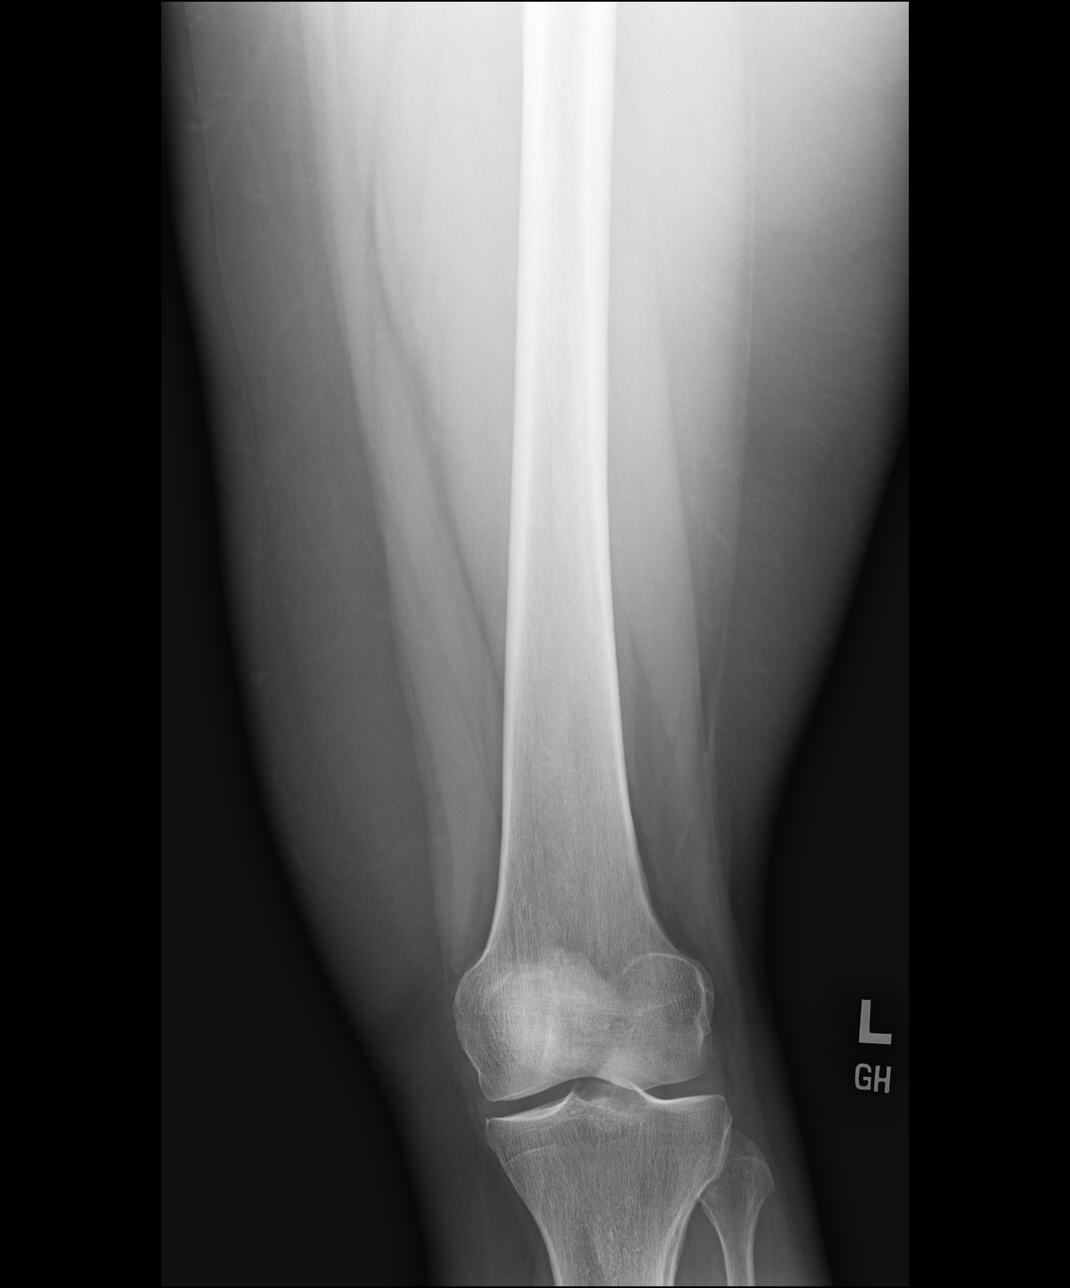

[dg femur min 2 views left (2 of 4)]
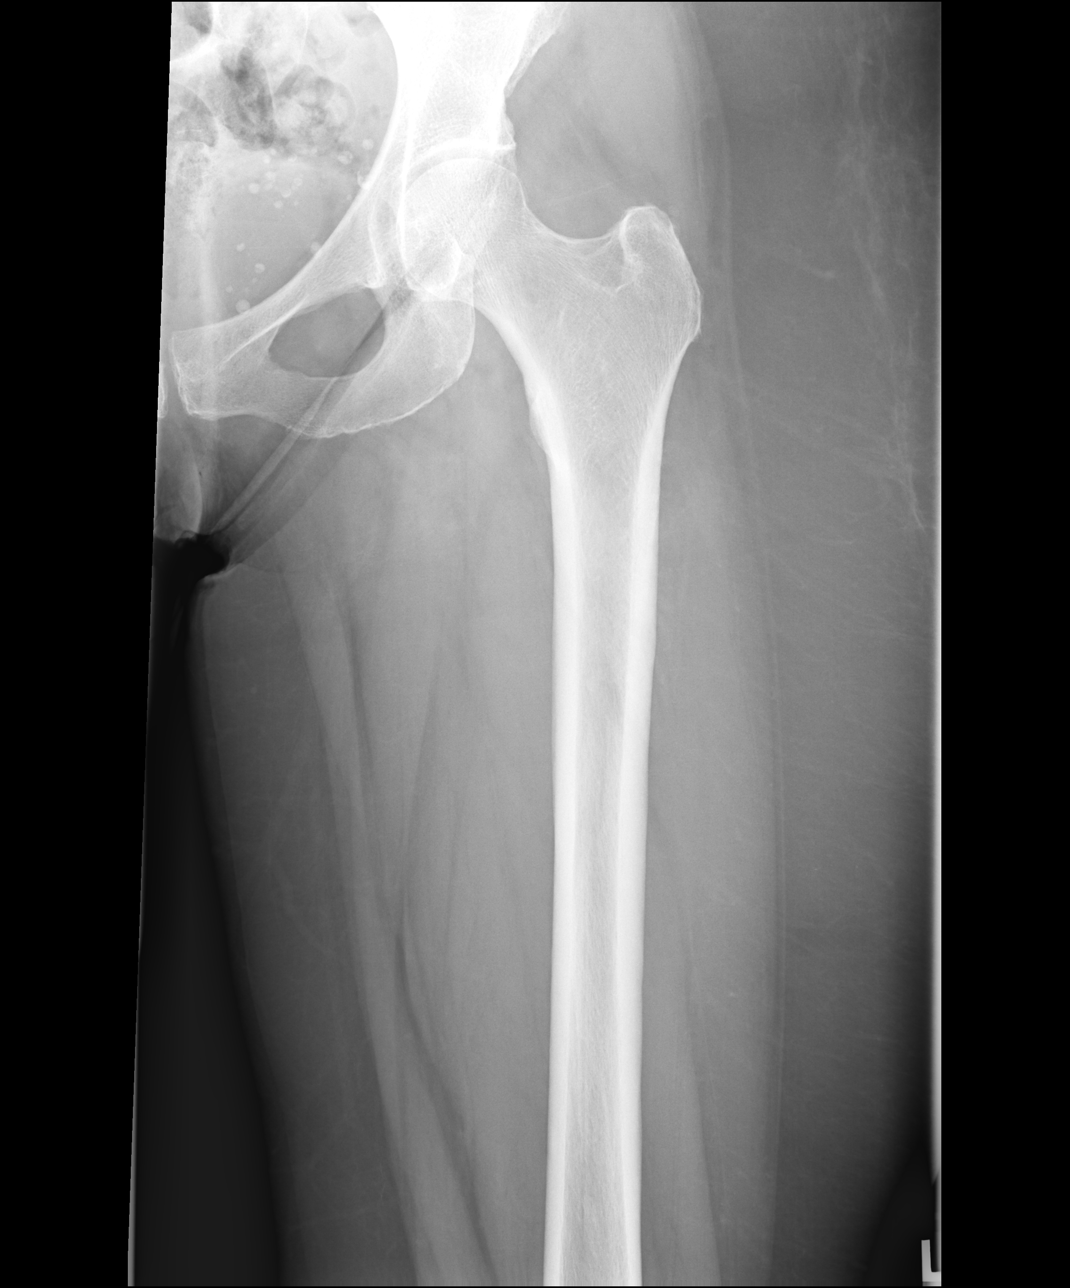

[dg femur min 2 views left (3 of 4)]
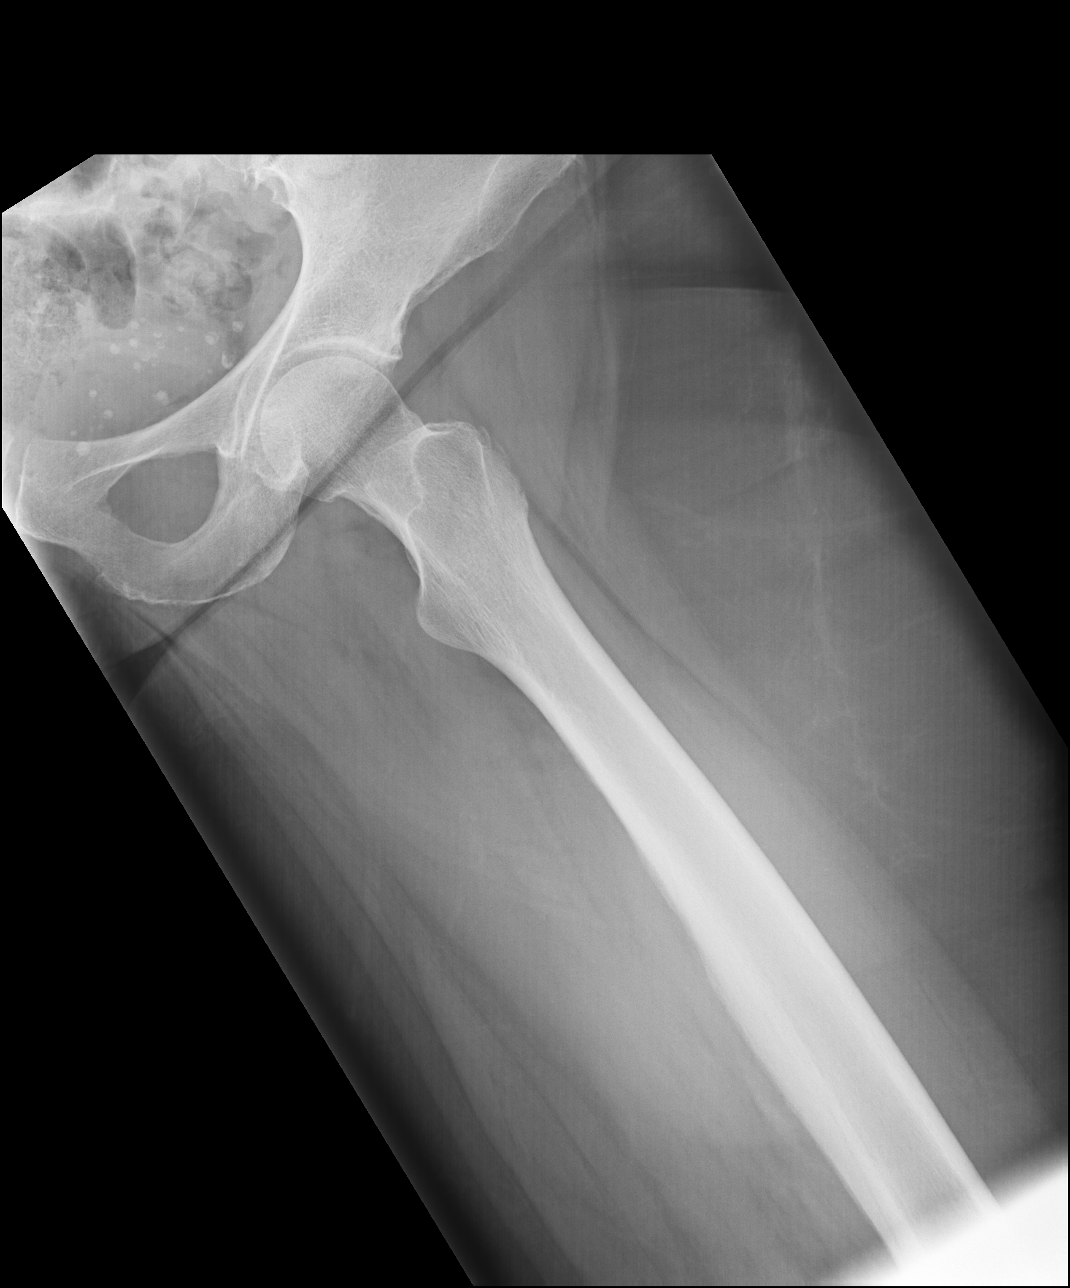

[dg femur min 2 views left (4 of 4)]
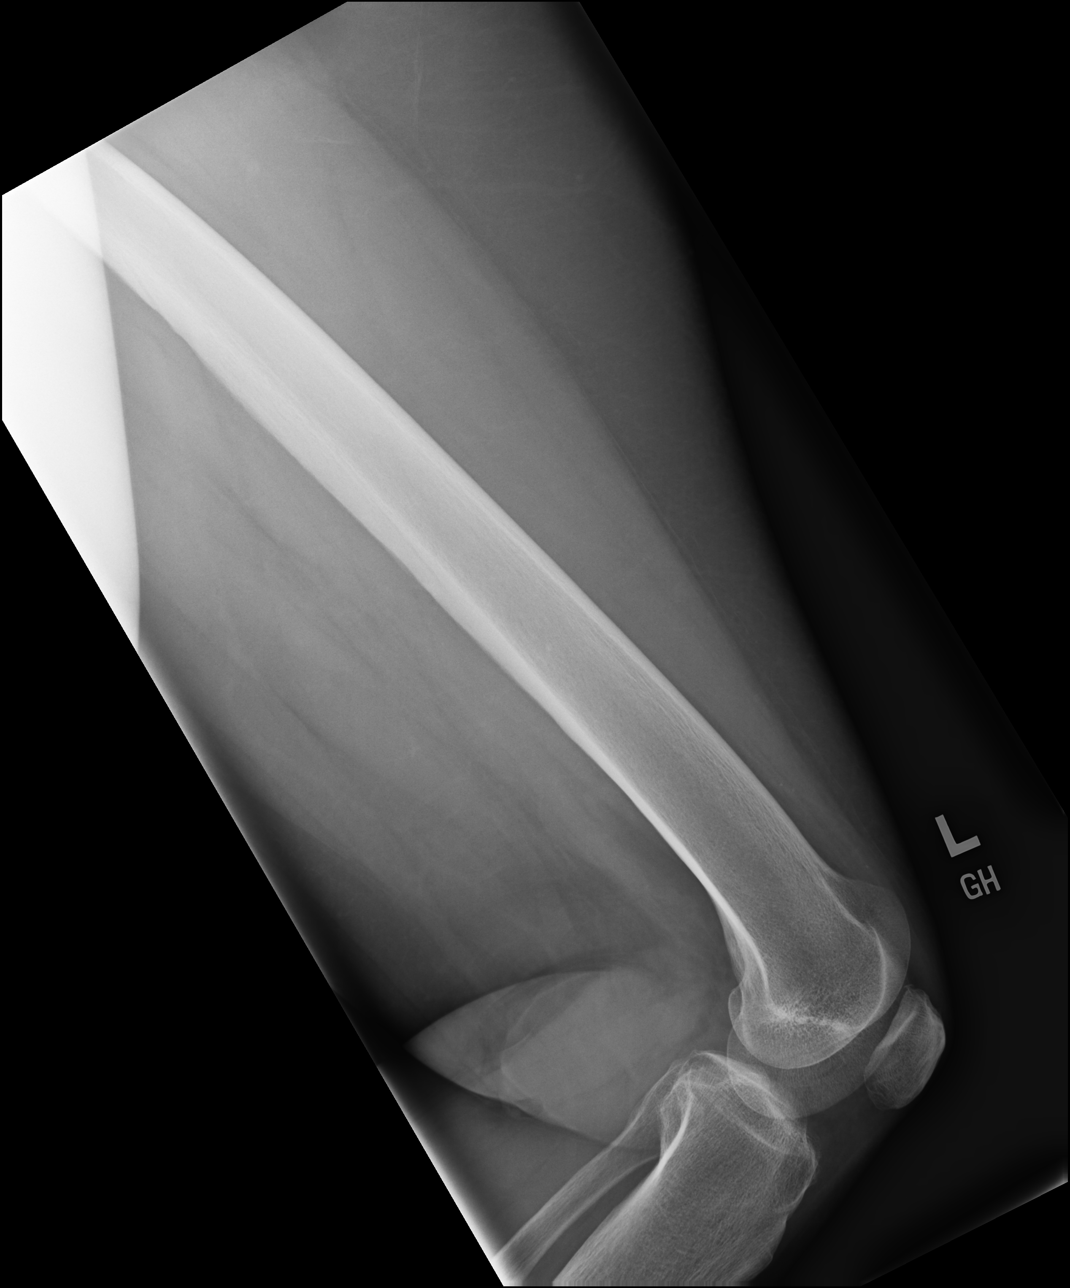

[4 of 4 positions shown; findings below may reference images not displayed]

FINDINGS: There is no evidence of fracture or other focal bone lesions. Soft
tissues are unremarkable.
IMPRESSION: Negative.

## 2023-09-30 DIAGNOSIS — J019 Acute sinusitis, unspecified: Secondary | ICD-10-CM | POA: Diagnosis not present

## 2023-10-01 DIAGNOSIS — M170 Bilateral primary osteoarthritis of knee: Secondary | ICD-10-CM | POA: Diagnosis not present

## 2023-10-01 DIAGNOSIS — M25561 Pain in right knee: Secondary | ICD-10-CM | POA: Diagnosis not present

## 2023-10-01 DIAGNOSIS — M25551 Pain in right hip: Secondary | ICD-10-CM | POA: Diagnosis not present

## 2023-10-08 DIAGNOSIS — M25562 Pain in left knee: Secondary | ICD-10-CM | POA: Diagnosis not present

## 2023-10-08 DIAGNOSIS — M25561 Pain in right knee: Secondary | ICD-10-CM | POA: Diagnosis not present

## 2023-10-10 DIAGNOSIS — M25561 Pain in right knee: Secondary | ICD-10-CM | POA: Diagnosis not present

## 2023-10-16 DIAGNOSIS — M25562 Pain in left knee: Secondary | ICD-10-CM | POA: Diagnosis not present

## 2023-10-16 DIAGNOSIS — M25561 Pain in right knee: Secondary | ICD-10-CM | POA: Diagnosis not present

## 2023-10-27 DIAGNOSIS — M25562 Pain in left knee: Secondary | ICD-10-CM | POA: Diagnosis not present

## 2023-10-27 DIAGNOSIS — M25561 Pain in right knee: Secondary | ICD-10-CM | POA: Diagnosis not present

## 2023-10-30 DIAGNOSIS — M25562 Pain in left knee: Secondary | ICD-10-CM | POA: Diagnosis not present

## 2023-10-30 DIAGNOSIS — M25561 Pain in right knee: Secondary | ICD-10-CM | POA: Diagnosis not present

## 2023-11-03 DIAGNOSIS — M25561 Pain in right knee: Secondary | ICD-10-CM | POA: Diagnosis not present

## 2023-11-03 DIAGNOSIS — M25562 Pain in left knee: Secondary | ICD-10-CM | POA: Diagnosis not present

## 2023-11-04 DIAGNOSIS — K080 Exfoliation of teeth due to systemic causes: Secondary | ICD-10-CM | POA: Diagnosis not present

## 2023-11-06 DIAGNOSIS — M25562 Pain in left knee: Secondary | ICD-10-CM | POA: Diagnosis not present

## 2023-11-06 DIAGNOSIS — M25561 Pain in right knee: Secondary | ICD-10-CM | POA: Diagnosis not present

## 2023-11-12 DIAGNOSIS — M25561 Pain in right knee: Secondary | ICD-10-CM | POA: Diagnosis not present

## 2023-11-12 DIAGNOSIS — R252 Cramp and spasm: Secondary | ICD-10-CM | POA: Diagnosis not present

## 2023-11-12 DIAGNOSIS — M25562 Pain in left knee: Secondary | ICD-10-CM | POA: Diagnosis not present

## 2023-12-24 DIAGNOSIS — R7303 Prediabetes: Secondary | ICD-10-CM | POA: Diagnosis not present

## 2023-12-24 DIAGNOSIS — M797 Fibromyalgia: Secondary | ICD-10-CM | POA: Diagnosis not present

## 2023-12-24 DIAGNOSIS — Z23 Encounter for immunization: Secondary | ICD-10-CM | POA: Diagnosis not present

## 2023-12-24 DIAGNOSIS — Z0000 Encounter for general adult medical examination without abnormal findings: Secondary | ICD-10-CM | POA: Diagnosis not present

## 2023-12-24 DIAGNOSIS — E559 Vitamin D deficiency, unspecified: Secondary | ICD-10-CM | POA: Diagnosis not present

## 2023-12-24 DIAGNOSIS — Z9884 Bariatric surgery status: Secondary | ICD-10-CM | POA: Diagnosis not present

## 2023-12-24 DIAGNOSIS — Z1331 Encounter for screening for depression: Secondary | ICD-10-CM | POA: Diagnosis not present

## 2023-12-24 DIAGNOSIS — F411 Generalized anxiety disorder: Secondary | ICD-10-CM | POA: Diagnosis not present

## 2024-01-15 DIAGNOSIS — M25562 Pain in left knee: Secondary | ICD-10-CM | POA: Diagnosis not present

## 2024-01-15 DIAGNOSIS — M25561 Pain in right knee: Secondary | ICD-10-CM | POA: Diagnosis not present

## 2024-01-27 DIAGNOSIS — Z8742 Personal history of other diseases of the female genital tract: Secondary | ICD-10-CM | POA: Diagnosis not present

## 2024-01-27 DIAGNOSIS — Z8542 Personal history of malignant neoplasm of other parts of uterus: Secondary | ICD-10-CM | POA: Diagnosis not present

## 2024-01-27 DIAGNOSIS — Z1231 Encounter for screening mammogram for malignant neoplasm of breast: Secondary | ICD-10-CM | POA: Diagnosis not present

## 2024-01-27 DIAGNOSIS — Z01419 Encounter for gynecological examination (general) (routine) without abnormal findings: Secondary | ICD-10-CM | POA: Diagnosis not present

## 2024-01-27 DIAGNOSIS — Z8481 Family history of carrier of genetic disease: Secondary | ICD-10-CM | POA: Diagnosis not present

## 2024-02-25 DIAGNOSIS — H524 Presbyopia: Secondary | ICD-10-CM | POA: Diagnosis not present
